# Patient Record
Sex: Female | Born: 1955 | Hispanic: Yes | Marital: Married | Smoking: Current every day smoker
Health system: Southern US, Community
[De-identification: ages and names within clinical notes are randomized; demographics above are authoritative.]

## PROBLEM LIST (undated history)

## (undated) DIAGNOSIS — K219 Gastro-esophageal reflux disease without esophagitis: Secondary | ICD-10-CM

## (undated) DIAGNOSIS — L709 Acne, unspecified: Secondary | ICD-10-CM

## (undated) DIAGNOSIS — M810 Age-related osteoporosis without current pathological fracture: Secondary | ICD-10-CM

## (undated) DIAGNOSIS — J45909 Unspecified asthma, uncomplicated: Secondary | ICD-10-CM

## (undated) DIAGNOSIS — F4310 Post-traumatic stress disorder, unspecified: Secondary | ICD-10-CM

## (undated) DIAGNOSIS — K5792 Diverticulitis of intestine, part unspecified, without perforation or abscess without bleeding: Secondary | ICD-10-CM

## (undated) DIAGNOSIS — F5104 Psychophysiologic insomnia: Secondary | ICD-10-CM

## (undated) DIAGNOSIS — M797 Fibromyalgia: Secondary | ICD-10-CM

## (undated) DIAGNOSIS — M51369 Other intervertebral disc degeneration, lumbar region without mention of lumbar back pain or lower extremity pain: Secondary | ICD-10-CM

## (undated) DIAGNOSIS — J449 Chronic obstructive pulmonary disease, unspecified: Secondary | ICD-10-CM

## (undated) DIAGNOSIS — F429 Obsessive-compulsive disorder, unspecified: Secondary | ICD-10-CM

## (undated) DIAGNOSIS — K635 Polyp of colon: Secondary | ICD-10-CM

## (undated) DIAGNOSIS — K2970 Gastritis, unspecified, without bleeding: Secondary | ICD-10-CM

## (undated) DIAGNOSIS — F419 Anxiety disorder, unspecified: Secondary | ICD-10-CM

## (undated) DIAGNOSIS — M5136 Other intervertebral disc degeneration, lumbar region: Secondary | ICD-10-CM

## (undated) HISTORY — DX: Gastritis, unspecified, without bleeding: K29.70

## (undated) HISTORY — DX: Unspecified asthma, uncomplicated: J45.909

## (undated) HISTORY — DX: Post-traumatic stress disorder, unspecified: F43.10

## (undated) HISTORY — DX: Chronic obstructive pulmonary disease, unspecified: J44.9

## (undated) HISTORY — PX: OTHER SURGICAL HISTORY: SHX169

## (undated) HISTORY — DX: Gastro-esophageal reflux disease without esophagitis: K21.9

## (undated) HISTORY — DX: Psychophysiologic insomnia: F51.04

## (undated) HISTORY — DX: Anxiety disorder, unspecified: F41.9

## (undated) HISTORY — DX: Acne, unspecified: L70.9

## (undated) HISTORY — DX: Diverticulitis of intestine, part unspecified, without perforation or abscess without bleeding: K57.92

## (undated) HISTORY — DX: Other intervertebral disc degeneration, lumbar region without mention of lumbar back pain or lower extremity pain: M51.369

## (undated) HISTORY — DX: Other intervertebral disc degeneration, lumbar region: M51.36

## (undated) HISTORY — DX: Polyp of colon: K63.5

## (undated) HISTORY — DX: Fibromyalgia: M79.7

## (undated) HISTORY — DX: Obsessive-compulsive disorder, unspecified: F42.9

## (undated) HISTORY — DX: Age-related osteoporosis without current pathological fracture: M81.0

---

## 2011-04-10 DIAGNOSIS — I4510 Unspecified right bundle-branch block: Secondary | ICD-10-CM | POA: Insufficient documentation

## 2012-11-11 DIAGNOSIS — K648 Other hemorrhoids: Secondary | ICD-10-CM | POA: Insufficient documentation

## 2012-11-11 DIAGNOSIS — F1210 Cannabis abuse, uncomplicated: Secondary | ICD-10-CM | POA: Insufficient documentation

## 2012-11-11 DIAGNOSIS — F1410 Cocaine abuse, uncomplicated: Secondary | ICD-10-CM | POA: Insufficient documentation

## 2012-11-11 DIAGNOSIS — F1010 Alcohol abuse, uncomplicated: Secondary | ICD-10-CM | POA: Insufficient documentation

## 2012-11-11 DIAGNOSIS — M159 Polyosteoarthritis, unspecified: Secondary | ICD-10-CM | POA: Insufficient documentation

## 2012-11-11 DIAGNOSIS — F419 Anxiety disorder, unspecified: Secondary | ICD-10-CM | POA: Insufficient documentation

## 2014-03-17 ENCOUNTER — Inpatient Hospital Stay (HOSPITAL_COMMUNITY): Admission: RE | Admit: 2014-03-17 | Payer: Self-pay | Source: Ambulatory Visit | Admitting: Orthopedic Surgery

## 2014-03-17 ENCOUNTER — Encounter (HOSPITAL_COMMUNITY): Admission: RE | Payer: Self-pay | Source: Ambulatory Visit

## 2014-03-17 SURGERY — ARTHROPLASTY, HIP, TOTAL, ANTERIOR APPROACH
Anesthesia: Choice | Site: Hip | Laterality: Left

## 2014-05-18 ENCOUNTER — Encounter (HOSPITAL_COMMUNITY): Admission: RE | Payer: Self-pay | Source: Ambulatory Visit

## 2014-05-18 ENCOUNTER — Inpatient Hospital Stay (HOSPITAL_COMMUNITY): Admission: RE | Admit: 2014-05-18 | Payer: Self-pay | Source: Ambulatory Visit | Admitting: Orthopedic Surgery

## 2014-05-18 SURGERY — ARTHROPLASTY, HIP, TOTAL, ANTERIOR APPROACH
Anesthesia: Spinal | Laterality: Left

## 2014-06-19 ENCOUNTER — Ambulatory Visit: Payer: Self-pay | Admitting: Physician Assistant

## 2014-06-29 ENCOUNTER — Ambulatory Visit: Payer: Self-pay | Admitting: Physician Assistant

## 2014-07-13 ENCOUNTER — Ambulatory Visit: Payer: Self-pay | Admitting: Physician Assistant

## 2014-09-04 DIAGNOSIS — M797 Fibromyalgia: Secondary | ICD-10-CM | POA: Insufficient documentation

## 2014-09-04 DIAGNOSIS — Z96642 Presence of left artificial hip joint: Secondary | ICD-10-CM | POA: Insufficient documentation

## 2014-09-04 DIAGNOSIS — F319 Bipolar disorder, unspecified: Secondary | ICD-10-CM | POA: Insufficient documentation

## 2014-09-04 HISTORY — PX: TOTAL HIP ARTHROPLASTY: SHX124

## 2014-12-01 ENCOUNTER — Ambulatory Visit: Payer: Medicare Other | Admitting: Diagnostic Neuroimaging

## 2014-12-17 ENCOUNTER — Encounter: Payer: Self-pay | Admitting: *Deleted

## 2014-12-21 ENCOUNTER — Ambulatory Visit: Payer: Medicare Other | Admitting: Diagnostic Neuroimaging

## 2015-01-13 ENCOUNTER — Ambulatory Visit (INDEPENDENT_AMBULATORY_CARE_PROVIDER_SITE_OTHER): Payer: Medicare Other | Admitting: Diagnostic Neuroimaging

## 2015-01-13 ENCOUNTER — Encounter: Payer: Self-pay | Admitting: Diagnostic Neuroimaging

## 2015-01-13 VITALS — BP 117/71 | HR 63 | Ht 65.5 in | Wt 136.5 lb

## 2015-01-13 DIAGNOSIS — R413 Other amnesia: Secondary | ICD-10-CM

## 2015-01-13 DIAGNOSIS — G894 Chronic pain syndrome: Secondary | ICD-10-CM | POA: Diagnosis not present

## 2015-01-13 DIAGNOSIS — R51 Headache: Secondary | ICD-10-CM | POA: Diagnosis not present

## 2015-01-13 DIAGNOSIS — M797 Fibromyalgia: Secondary | ICD-10-CM | POA: Diagnosis not present

## 2015-01-13 DIAGNOSIS — R519 Headache, unspecified: Secondary | ICD-10-CM

## 2015-01-13 NOTE — Patient Instructions (Signed)
-   check MRI brain to rule out other causes of headache and memory loss - follow up with psychiatry and pain management for long term treatment
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
## 2015-02-11 ENCOUNTER — Other Ambulatory Visit: Payer: Self-pay

## 2015-03-02 ENCOUNTER — Inpatient Hospital Stay: Admission: RE | Admit: 2015-03-02 | Payer: Self-pay | Source: Ambulatory Visit

## 2015-03-14 ENCOUNTER — Inpatient Hospital Stay: Admission: RE | Admit: 2015-03-14 | Payer: Self-pay | Source: Ambulatory Visit

## 2015-03-31 ENCOUNTER — Inpatient Hospital Stay: Admission: RE | Admit: 2015-03-31 | Payer: Self-pay | Source: Ambulatory Visit

## 2015-04-20 ENCOUNTER — Ambulatory Visit: Payer: Medicare Other | Admitting: Diagnostic Neuroimaging

## 2015-05-05 ENCOUNTER — Ambulatory Visit: Payer: Medicare Other | Admitting: Diagnostic Neuroimaging

## 2015-05-06 ENCOUNTER — Encounter: Payer: Self-pay | Admitting: Diagnostic Neuroimaging

## 2016-06-06 DIAGNOSIS — Z720 Tobacco use: Secondary | ICD-10-CM | POA: Insufficient documentation

## 2016-06-06 DIAGNOSIS — M1990 Unspecified osteoarthritis, unspecified site: Secondary | ICD-10-CM | POA: Insufficient documentation

## 2016-06-06 DIAGNOSIS — E559 Vitamin D deficiency, unspecified: Secondary | ICD-10-CM | POA: Insufficient documentation

## 2016-06-06 DIAGNOSIS — R768 Other specified abnormal immunological findings in serum: Secondary | ICD-10-CM | POA: Insufficient documentation

## 2016-06-06 DIAGNOSIS — S838X2S Sprain of other specified parts of left knee, sequela: Secondary | ICD-10-CM | POA: Insufficient documentation

## 2016-06-06 DIAGNOSIS — M1612 Unilateral primary osteoarthritis, left hip: Secondary | ICD-10-CM | POA: Insufficient documentation

## 2016-06-15 LAB — HM PAP SMEAR: HM PAP: NORMAL

## 2016-06-16 ENCOUNTER — Other Ambulatory Visit: Payer: Self-pay | Admitting: Obstetrics and Gynecology

## 2016-06-16 DIAGNOSIS — Z1239 Encounter for other screening for malignant neoplasm of breast: Secondary | ICD-10-CM

## 2016-06-19 ENCOUNTER — Other Ambulatory Visit: Payer: Self-pay | Admitting: Obstetrics and Gynecology

## 2016-06-19 DIAGNOSIS — Z1239 Encounter for other screening for malignant neoplasm of breast: Secondary | ICD-10-CM

## 2016-06-19 DIAGNOSIS — N644 Mastodynia: Secondary | ICD-10-CM

## 2016-07-12 ENCOUNTER — Inpatient Hospital Stay
Admission: RE | Admit: 2016-07-12 | Discharge: 2016-07-12 | Disposition: A | Discharge status: Home / Self Care | Payer: Self-pay | Source: Ambulatory Visit | Attending: *Deleted | Admitting: *Deleted

## 2016-07-12 ENCOUNTER — Other Ambulatory Visit: Payer: Self-pay | Admitting: *Deleted

## 2016-07-12 DIAGNOSIS — Z9289 Personal history of other medical treatment: Secondary | ICD-10-CM

## 2016-07-18 ENCOUNTER — Telehealth: Payer: Self-pay | Admitting: Obstetrics and Gynecology

## 2016-07-18 NOTE — Telephone Encounter (Signed)
Please let pt know pap smear was negative. Thx.

## 2016-07-18 NOTE — Telephone Encounter (Signed)
I spoke to the patient to give her a mammogram appointment, and she asked for someone to give her a call back with her pap results. Please advise.

## 2016-07-19 NOTE — Telephone Encounter (Signed)
Pt aware of normal pap. 

## 2016-07-27 ENCOUNTER — Ambulatory Visit: Payer: Self-pay

## 2016-07-27 ENCOUNTER — Other Ambulatory Visit: Payer: Self-pay

## 2016-08-08 ENCOUNTER — Other Ambulatory Visit: Payer: Self-pay

## 2016-09-12 ENCOUNTER — Ambulatory Visit: Admission: RE | Admit: 2016-09-12 | Payer: Self-pay | Source: Ambulatory Visit

## 2016-09-12 ENCOUNTER — Other Ambulatory Visit: Payer: Self-pay

## 2016-09-12 ENCOUNTER — Inpatient Hospital Stay: Admission: RE | Admit: 2016-09-12 | Payer: Self-pay | Source: Ambulatory Visit

## 2016-11-24 DIAGNOSIS — G4733 Obstructive sleep apnea (adult) (pediatric): Secondary | ICD-10-CM | POA: Diagnosis not present

## 2016-11-27 ENCOUNTER — Telehealth: Payer: Self-pay

## 2016-11-27 NOTE — Telephone Encounter (Signed)
Pt calling for pap results.  Adv pap was negative.  Pt c/o d/c and is uncomfortable c voiding.  Appt 4:30 Thurs c ABC.

## 2016-11-30 ENCOUNTER — Ambulatory Visit: Payer: Medicare Other | Admitting: Obstetrics and Gynecology

## 2016-12-06 DIAGNOSIS — J42 Unspecified chronic bronchitis: Secondary | ICD-10-CM | POA: Diagnosis not present

## 2016-12-13 ENCOUNTER — Ambulatory Visit: Payer: Medicare Other | Admitting: Family Medicine

## 2016-12-20 DIAGNOSIS — E119 Type 2 diabetes mellitus without complications: Secondary | ICD-10-CM | POA: Diagnosis not present

## 2016-12-20 DIAGNOSIS — M797 Fibromyalgia: Secondary | ICD-10-CM | POA: Diagnosis not present

## 2016-12-20 DIAGNOSIS — G8929 Other chronic pain: Secondary | ICD-10-CM | POA: Diagnosis not present

## 2016-12-20 DIAGNOSIS — Z96642 Presence of left artificial hip joint: Secondary | ICD-10-CM | POA: Diagnosis not present

## 2016-12-20 DIAGNOSIS — M545 Low back pain: Secondary | ICD-10-CM | POA: Diagnosis not present

## 2016-12-20 DIAGNOSIS — M810 Age-related osteoporosis without current pathological fracture: Secondary | ICD-10-CM | POA: Diagnosis not present

## 2016-12-20 DIAGNOSIS — M5442 Lumbago with sciatica, left side: Secondary | ICD-10-CM | POA: Diagnosis not present

## 2016-12-20 DIAGNOSIS — Z471 Aftercare following joint replacement surgery: Secondary | ICD-10-CM | POA: Diagnosis not present

## 2016-12-20 DIAGNOSIS — J449 Chronic obstructive pulmonary disease, unspecified: Secondary | ICD-10-CM | POA: Diagnosis not present

## 2016-12-20 DIAGNOSIS — M25551 Pain in right hip: Secondary | ICD-10-CM | POA: Diagnosis not present

## 2016-12-20 DIAGNOSIS — M25552 Pain in left hip: Secondary | ICD-10-CM | POA: Diagnosis not present

## 2016-12-25 DIAGNOSIS — G4733 Obstructive sleep apnea (adult) (pediatric): Secondary | ICD-10-CM | POA: Diagnosis not present

## 2016-12-29 DIAGNOSIS — J42 Unspecified chronic bronchitis: Secondary | ICD-10-CM | POA: Diagnosis not present

## 2017-01-02 DIAGNOSIS — M545 Low back pain: Secondary | ICD-10-CM | POA: Diagnosis not present

## 2017-01-02 DIAGNOSIS — M5432 Sciatica, left side: Secondary | ICD-10-CM | POA: Diagnosis not present

## 2017-01-10 DIAGNOSIS — M4316 Spondylolisthesis, lumbar region: Secondary | ICD-10-CM | POA: Diagnosis not present

## 2017-01-10 DIAGNOSIS — Z8261 Family history of arthritis: Secondary | ICD-10-CM | POA: Diagnosis not present

## 2017-01-10 DIAGNOSIS — M4317 Spondylolisthesis, lumbosacral region: Secondary | ICD-10-CM | POA: Diagnosis not present

## 2017-01-10 DIAGNOSIS — Z888 Allergy status to other drugs, medicaments and biological substances status: Secondary | ICD-10-CM | POA: Diagnosis not present

## 2017-01-10 DIAGNOSIS — Z7951 Long term (current) use of inhaled steroids: Secondary | ICD-10-CM | POA: Diagnosis not present

## 2017-01-10 DIAGNOSIS — Z96642 Presence of left artificial hip joint: Secondary | ICD-10-CM | POA: Diagnosis not present

## 2017-01-10 DIAGNOSIS — M8588 Other specified disorders of bone density and structure, other site: Secondary | ICD-10-CM | POA: Diagnosis not present

## 2017-01-10 DIAGNOSIS — G4700 Insomnia, unspecified: Secondary | ICD-10-CM | POA: Diagnosis not present

## 2017-01-10 DIAGNOSIS — M797 Fibromyalgia: Secondary | ICD-10-CM | POA: Diagnosis not present

## 2017-01-10 DIAGNOSIS — M25562 Pain in left knee: Secondary | ICD-10-CM | POA: Diagnosis not present

## 2017-01-10 DIAGNOSIS — Z8262 Family history of osteoporosis: Secondary | ICD-10-CM | POA: Diagnosis not present

## 2017-01-10 DIAGNOSIS — Z7952 Long term (current) use of systemic steroids: Secondary | ICD-10-CM | POA: Diagnosis not present

## 2017-01-10 DIAGNOSIS — M810 Age-related osteoporosis without current pathological fracture: Secondary | ICD-10-CM | POA: Diagnosis not present

## 2017-01-10 DIAGNOSIS — J449 Chronic obstructive pulmonary disease, unspecified: Secondary | ICD-10-CM | POA: Diagnosis not present

## 2017-01-10 DIAGNOSIS — Z882 Allergy status to sulfonamides status: Secondary | ICD-10-CM | POA: Diagnosis not present

## 2017-01-16 ENCOUNTER — Ambulatory Visit: Payer: Medicare Other | Admitting: Family Medicine

## 2017-01-23 DIAGNOSIS — J42 Unspecified chronic bronchitis: Secondary | ICD-10-CM | POA: Diagnosis not present

## 2017-01-26 DIAGNOSIS — M25552 Pain in left hip: Secondary | ICD-10-CM | POA: Diagnosis not present

## 2017-01-26 DIAGNOSIS — Z96642 Presence of left artificial hip joint: Secondary | ICD-10-CM | POA: Diagnosis not present

## 2017-01-26 DIAGNOSIS — G8929 Other chronic pain: Secondary | ICD-10-CM | POA: Diagnosis not present

## 2017-01-26 DIAGNOSIS — M25559 Pain in unspecified hip: Secondary | ICD-10-CM | POA: Diagnosis not present

## 2017-02-05 DIAGNOSIS — H40013 Open angle with borderline findings, low risk, bilateral: Secondary | ICD-10-CM | POA: Diagnosis not present

## 2017-02-05 DIAGNOSIS — H25813 Combined forms of age-related cataract, bilateral: Secondary | ICD-10-CM | POA: Diagnosis not present

## 2017-02-13 ENCOUNTER — Ambulatory Visit: Payer: Medicare Other | Admitting: Family Medicine

## 2017-02-15 DIAGNOSIS — M79644 Pain in right finger(s): Secondary | ICD-10-CM | POA: Diagnosis not present

## 2017-02-15 DIAGNOSIS — M1710 Unilateral primary osteoarthritis, unspecified knee: Secondary | ICD-10-CM | POA: Diagnosis not present

## 2017-02-15 DIAGNOSIS — M25562 Pain in left knee: Secondary | ICD-10-CM | POA: Diagnosis not present

## 2017-02-15 DIAGNOSIS — M19041 Primary osteoarthritis, right hand: Secondary | ICD-10-CM | POA: Diagnosis not present

## 2017-02-15 DIAGNOSIS — M25531 Pain in right wrist: Secondary | ICD-10-CM | POA: Diagnosis not present

## 2017-02-15 DIAGNOSIS — G8929 Other chronic pain: Secondary | ICD-10-CM | POA: Diagnosis not present

## 2017-02-15 DIAGNOSIS — M170 Bilateral primary osteoarthritis of knee: Secondary | ICD-10-CM | POA: Diagnosis not present

## 2017-02-28 DIAGNOSIS — J42 Unspecified chronic bronchitis: Secondary | ICD-10-CM | POA: Diagnosis not present

## 2017-02-28 DIAGNOSIS — M4317 Spondylolisthesis, lumbosacral region: Secondary | ICD-10-CM | POA: Diagnosis not present

## 2017-03-13 DIAGNOSIS — M1710 Unilateral primary osteoarthritis, unspecified knee: Secondary | ICD-10-CM | POA: Diagnosis not present

## 2017-03-13 DIAGNOSIS — M25462 Effusion, left knee: Secondary | ICD-10-CM | POA: Diagnosis not present

## 2017-03-13 DIAGNOSIS — M4317 Spondylolisthesis, lumbosacral region: Secondary | ICD-10-CM | POA: Diagnosis not present

## 2017-03-13 DIAGNOSIS — M23307 Other meniscus derangements, unspecified meniscus, left knee: Secondary | ICD-10-CM | POA: Diagnosis not present

## 2017-03-14 ENCOUNTER — Ambulatory Visit: Payer: Medicare Other | Admitting: Family Medicine

## 2017-03-15 DIAGNOSIS — Z760 Encounter for issue of repeat prescription: Secondary | ICD-10-CM | POA: Diagnosis not present

## 2017-03-15 DIAGNOSIS — M25462 Effusion, left knee: Secondary | ICD-10-CM | POA: Diagnosis not present

## 2017-03-15 DIAGNOSIS — M4317 Spondylolisthesis, lumbosacral region: Secondary | ICD-10-CM | POA: Diagnosis not present

## 2017-03-15 DIAGNOSIS — M1710 Unilateral primary osteoarthritis, unspecified knee: Secondary | ICD-10-CM | POA: Diagnosis not present

## 2017-03-15 DIAGNOSIS — M23307 Other meniscus derangements, unspecified meniscus, left knee: Secondary | ICD-10-CM | POA: Diagnosis not present

## 2017-03-20 DIAGNOSIS — M1710 Unilateral primary osteoarthritis, unspecified knee: Secondary | ICD-10-CM | POA: Diagnosis not present

## 2017-03-20 DIAGNOSIS — M23307 Other meniscus derangements, unspecified meniscus, left knee: Secondary | ICD-10-CM | POA: Diagnosis not present

## 2017-03-20 DIAGNOSIS — M4317 Spondylolisthesis, lumbosacral region: Secondary | ICD-10-CM | POA: Diagnosis not present

## 2017-03-20 DIAGNOSIS — M25462 Effusion, left knee: Secondary | ICD-10-CM | POA: Diagnosis not present

## 2017-03-21 DIAGNOSIS — M4317 Spondylolisthesis, lumbosacral region: Secondary | ICD-10-CM | POA: Diagnosis not present

## 2017-03-21 DIAGNOSIS — M23307 Other meniscus derangements, unspecified meniscus, left knee: Secondary | ICD-10-CM | POA: Diagnosis not present

## 2017-03-21 DIAGNOSIS — M1710 Unilateral primary osteoarthritis, unspecified knee: Secondary | ICD-10-CM | POA: Diagnosis not present

## 2017-03-21 DIAGNOSIS — M25462 Effusion, left knee: Secondary | ICD-10-CM | POA: Diagnosis not present

## 2017-03-27 DIAGNOSIS — M23307 Other meniscus derangements, unspecified meniscus, left knee: Secondary | ICD-10-CM | POA: Diagnosis not present

## 2017-03-27 DIAGNOSIS — M4317 Spondylolisthesis, lumbosacral region: Secondary | ICD-10-CM | POA: Diagnosis not present

## 2017-03-27 DIAGNOSIS — M1710 Unilateral primary osteoarthritis, unspecified knee: Secondary | ICD-10-CM | POA: Diagnosis not present

## 2017-03-27 DIAGNOSIS — M25462 Effusion, left knee: Secondary | ICD-10-CM | POA: Diagnosis not present

## 2017-03-29 DIAGNOSIS — M4317 Spondylolisthesis, lumbosacral region: Secondary | ICD-10-CM | POA: Diagnosis not present

## 2017-03-29 DIAGNOSIS — M25462 Effusion, left knee: Secondary | ICD-10-CM | POA: Diagnosis not present

## 2017-03-29 DIAGNOSIS — M23307 Other meniscus derangements, unspecified meniscus, left knee: Secondary | ICD-10-CM | POA: Diagnosis not present

## 2017-03-29 DIAGNOSIS — M1710 Unilateral primary osteoarthritis, unspecified knee: Secondary | ICD-10-CM | POA: Diagnosis not present

## 2017-04-03 DIAGNOSIS — M1710 Unilateral primary osteoarthritis, unspecified knee: Secondary | ICD-10-CM | POA: Diagnosis not present

## 2017-04-03 DIAGNOSIS — M23307 Other meniscus derangements, unspecified meniscus, left knee: Secondary | ICD-10-CM | POA: Diagnosis not present

## 2017-04-03 DIAGNOSIS — M25462 Effusion, left knee: Secondary | ICD-10-CM | POA: Diagnosis not present

## 2017-04-03 DIAGNOSIS — M4317 Spondylolisthesis, lumbosacral region: Secondary | ICD-10-CM | POA: Diagnosis not present

## 2017-04-05 DIAGNOSIS — M1710 Unilateral primary osteoarthritis, unspecified knee: Secondary | ICD-10-CM | POA: Diagnosis not present

## 2017-04-05 DIAGNOSIS — M4317 Spondylolisthesis, lumbosacral region: Secondary | ICD-10-CM | POA: Diagnosis not present

## 2017-04-05 DIAGNOSIS — M545 Low back pain: Secondary | ICD-10-CM | POA: Diagnosis not present

## 2017-04-05 DIAGNOSIS — M23307 Other meniscus derangements, unspecified meniscus, left knee: Secondary | ICD-10-CM | POA: Diagnosis not present

## 2017-04-05 DIAGNOSIS — M25462 Effusion, left knee: Secondary | ICD-10-CM | POA: Diagnosis not present

## 2017-04-09 DIAGNOSIS — M25462 Effusion, left knee: Secondary | ICD-10-CM | POA: Diagnosis not present

## 2017-04-09 DIAGNOSIS — M23307 Other meniscus derangements, unspecified meniscus, left knee: Secondary | ICD-10-CM | POA: Diagnosis not present

## 2017-04-09 DIAGNOSIS — M4317 Spondylolisthesis, lumbosacral region: Secondary | ICD-10-CM | POA: Diagnosis not present

## 2017-04-09 DIAGNOSIS — M1710 Unilateral primary osteoarthritis, unspecified knee: Secondary | ICD-10-CM | POA: Diagnosis not present

## 2017-04-11 DIAGNOSIS — J209 Acute bronchitis, unspecified: Secondary | ICD-10-CM | POA: Diagnosis not present

## 2017-05-07 DIAGNOSIS — J42 Unspecified chronic bronchitis: Secondary | ICD-10-CM | POA: Diagnosis not present

## 2017-05-09 DIAGNOSIS — M1710 Unilateral primary osteoarthritis, unspecified knee: Secondary | ICD-10-CM | POA: Diagnosis not present

## 2017-05-09 DIAGNOSIS — M23307 Other meniscus derangements, unspecified meniscus, left knee: Secondary | ICD-10-CM | POA: Diagnosis not present

## 2017-05-09 DIAGNOSIS — M4317 Spondylolisthesis, lumbosacral region: Secondary | ICD-10-CM | POA: Diagnosis not present

## 2017-05-09 DIAGNOSIS — M25462 Effusion, left knee: Secondary | ICD-10-CM | POA: Diagnosis not present

## 2017-05-10 DIAGNOSIS — M25462 Effusion, left knee: Secondary | ICD-10-CM | POA: Diagnosis not present

## 2017-05-10 DIAGNOSIS — M4317 Spondylolisthesis, lumbosacral region: Secondary | ICD-10-CM | POA: Diagnosis not present

## 2017-05-10 DIAGNOSIS — M1710 Unilateral primary osteoarthritis, unspecified knee: Secondary | ICD-10-CM | POA: Diagnosis not present

## 2017-05-10 DIAGNOSIS — M23307 Other meniscus derangements, unspecified meniscus, left knee: Secondary | ICD-10-CM | POA: Diagnosis not present

## 2017-05-17 DIAGNOSIS — M23307 Other meniscus derangements, unspecified meniscus, left knee: Secondary | ICD-10-CM | POA: Diagnosis not present

## 2017-05-17 DIAGNOSIS — M4317 Spondylolisthesis, lumbosacral region: Secondary | ICD-10-CM | POA: Diagnosis not present

## 2017-05-17 DIAGNOSIS — M25462 Effusion, left knee: Secondary | ICD-10-CM | POA: Diagnosis not present

## 2017-05-17 DIAGNOSIS — M1710 Unilateral primary osteoarthritis, unspecified knee: Secondary | ICD-10-CM | POA: Diagnosis not present

## 2017-05-29 DIAGNOSIS — M1710 Unilateral primary osteoarthritis, unspecified knee: Secondary | ICD-10-CM | POA: Diagnosis not present

## 2017-05-29 DIAGNOSIS — M23307 Other meniscus derangements, unspecified meniscus, left knee: Secondary | ICD-10-CM | POA: Diagnosis not present

## 2017-05-29 DIAGNOSIS — M25462 Effusion, left knee: Secondary | ICD-10-CM | POA: Diagnosis not present

## 2017-05-29 DIAGNOSIS — M4317 Spondylolisthesis, lumbosacral region: Secondary | ICD-10-CM | POA: Diagnosis not present

## 2017-05-31 DIAGNOSIS — M4317 Spondylolisthesis, lumbosacral region: Secondary | ICD-10-CM | POA: Diagnosis not present

## 2017-05-31 DIAGNOSIS — M25462 Effusion, left knee: Secondary | ICD-10-CM | POA: Diagnosis not present

## 2017-05-31 DIAGNOSIS — H401131 Primary open-angle glaucoma, bilateral, mild stage: Secondary | ICD-10-CM | POA: Diagnosis not present

## 2017-05-31 DIAGNOSIS — M1710 Unilateral primary osteoarthritis, unspecified knee: Secondary | ICD-10-CM | POA: Diagnosis not present

## 2017-05-31 DIAGNOSIS — M23307 Other meniscus derangements, unspecified meniscus, left knee: Secondary | ICD-10-CM | POA: Diagnosis not present

## 2017-06-06 DIAGNOSIS — J42 Unspecified chronic bronchitis: Secondary | ICD-10-CM | POA: Diagnosis not present

## 2017-06-18 ENCOUNTER — Ambulatory Visit: Payer: Medicare Other | Admitting: Obstetrics and Gynecology

## 2017-06-19 ENCOUNTER — Ambulatory Visit: Payer: Medicare Other | Admitting: Family Medicine

## 2017-07-03 ENCOUNTER — Ambulatory Visit: Payer: Medicare Other

## 2017-07-05 DIAGNOSIS — G8929 Other chronic pain: Secondary | ICD-10-CM | POA: Diagnosis not present

## 2017-07-16 ENCOUNTER — Encounter: Payer: Self-pay | Admitting: Obstetrics and Gynecology

## 2017-07-16 ENCOUNTER — Encounter: Payer: Medicare Other | Admitting: Obstetrics and Gynecology

## 2017-07-17 NOTE — Progress Notes (Signed)
This encounter was created in error - please disregard.

## 2017-08-09 DIAGNOSIS — K210 Gastro-esophageal reflux disease with esophagitis: Secondary | ICD-10-CM | POA: Diagnosis not present

## 2017-08-09 DIAGNOSIS — K449 Diaphragmatic hernia without obstruction or gangrene: Secondary | ICD-10-CM | POA: Diagnosis not present

## 2017-08-09 DIAGNOSIS — K2970 Gastritis, unspecified, without bleeding: Secondary | ICD-10-CM | POA: Diagnosis not present

## 2017-08-09 DIAGNOSIS — R1013 Epigastric pain: Secondary | ICD-10-CM | POA: Diagnosis not present

## 2017-08-28 ENCOUNTER — Ambulatory Visit: Payer: Medicare Other | Admitting: Obstetrics and Gynecology

## 2018-02-26 DIAGNOSIS — M2540 Effusion, unspecified joint: Secondary | ICD-10-CM | POA: Insufficient documentation

## 2018-02-26 DIAGNOSIS — R609 Edema, unspecified: Secondary | ICD-10-CM | POA: Insufficient documentation

## 2018-09-13 DIAGNOSIS — M2240 Chondromalacia patellae, unspecified knee: Secondary | ICD-10-CM | POA: Insufficient documentation

## 2018-09-13 DIAGNOSIS — M47816 Spondylosis without myelopathy or radiculopathy, lumbar region: Secondary | ICD-10-CM | POA: Insufficient documentation

## 2018-09-16 ENCOUNTER — Other Ambulatory Visit: Payer: Self-pay | Admitting: Internal Medicine

## 2018-09-16 DIAGNOSIS — Z1231 Encounter for screening mammogram for malignant neoplasm of breast: Secondary | ICD-10-CM

## 2018-10-11 ENCOUNTER — Telehealth: Payer: Self-pay | Admitting: *Deleted

## 2018-10-11 NOTE — Telephone Encounter (Signed)
Voicemail left regarding scheduling of lung screening scan patient is referred for.

## 2018-10-16 ENCOUNTER — Telehealth: Payer: Self-pay | Admitting: *Deleted

## 2018-10-16 ENCOUNTER — Encounter: Payer: Self-pay | Admitting: *Deleted

## 2018-10-16 DIAGNOSIS — Z87891 Personal history of nicotine dependence: Secondary | ICD-10-CM

## 2018-10-16 DIAGNOSIS — Z122 Encounter for screening for malignant neoplasm of respiratory organs: Secondary | ICD-10-CM

## 2018-10-16 NOTE — Telephone Encounter (Signed)
Received referral for initial lung cancer screening scan. Contacted patient and obtained smoking history,(current, 49 pack year) as well as answering questions related to screening process. Patient denies signs of lung cancer such as weight loss or hemoptysis. Patient denies comorbidity that would prevent curative treatment if lung cancer were found. Patient is scheduled for shared decision making visit and CT scan on 10/24/18 at 1pm.

## 2018-10-24 ENCOUNTER — Other Ambulatory Visit: Payer: Self-pay

## 2018-10-24 ENCOUNTER — Inpatient Hospital Stay: Payer: Medicare Other | Attending: Nurse Practitioner | Admitting: Hospice and Palliative Medicine

## 2018-10-24 ENCOUNTER — Ambulatory Visit
Admission: RE | Admit: 2018-10-24 | Discharge: 2018-10-24 | Disposition: A | Discharge status: Home / Self Care | Payer: Medicare Other | Source: Ambulatory Visit | Attending: Nurse Practitioner | Admitting: Nurse Practitioner

## 2018-10-24 DIAGNOSIS — Z122 Encounter for screening for malignant neoplasm of respiratory organs: Secondary | ICD-10-CM

## 2018-10-24 DIAGNOSIS — Z87891 Personal history of nicotine dependence: Secondary | ICD-10-CM | POA: Insufficient documentation

## 2018-10-24 NOTE — Progress Notes (Signed)
In accordance with CMS guidelines, patient has met eligibility criteria including age, absence of signs or symptoms of lung cancer.  Social History   Tobacco Use  . Smoking status: Current Every Day Smoker    Packs/day: 1.00    Years: 49.00    Pack years: 49.00    Types: Cigarettes  Substance Use Topics  . Alcohol use: Yes    Alcohol/week: 0.0 standard drinks    Comment: occ  wine  . Drug use: No      A shared decision-making session was conducted prior to the performance of CT scan. This includes one or more decision aids, includes benefits and harms of screening, follow-up diagnostic testing, over-diagnosis, false positive rate, and total radiation exposure.   Counseling on the importance of adherence to annual lung cancer LDCT screening, impact of co-morbidities, and ability or willingness to undergo diagnosis and treatment is imperative for compliance of the program.   Counseling on the importance of continued smoking cessation for former smokers; the importance of smoking cessation for current smokers, and information about tobacco cessation interventions have been given to patient including [place] and [address] quit [place] programs.   Written order for lung cancer screening with LDCT has been given to the patient and any and all questions have been answered to the best of my abilities.    Yearly follow up will be coordinated by Burgess Estelle, Thoracic Navigator.  Time Total: 15 minutes  Visit consisted of counseling and education dealing with complex health screening. Greater than 50%  of this time was spent counseling and coordinating care related to the above assessment and plan.  Signed by: Altha Harm, PhD, NP-C [phone] (Work Cell)

## 2018-10-25 ENCOUNTER — Telehealth: Payer: Self-pay | Admitting: *Deleted

## 2018-10-25 NOTE — Telephone Encounter (Signed)
Notified patient of LDCT lung cancer screening program results with recommendation for 12 month follow up imaging. Also notified of incidental findings noted below and is encouraged to discuss further with PCP who will receive a copy of this note and/or the CT report. Patient verbalizes understanding.   IMPRESSION: 1. Lung-RADS 1, negative. Continue annual screening with low-dose chest CT without contrast in 12 months. 2. Nonobstructive bilateral nephrolithiasis. 3. Small pericardial effusion.  Aortic Atherosclerosis (ICD10-I70.0) and Emphysema (ICD10-J43.9).

## 2019-01-08 ENCOUNTER — Other Ambulatory Visit: Payer: Self-pay | Admitting: Student

## 2019-01-08 DIAGNOSIS — G8929 Other chronic pain: Secondary | ICD-10-CM

## 2019-01-08 DIAGNOSIS — M545 Low back pain, unspecified: Secondary | ICD-10-CM

## 2019-01-19 ENCOUNTER — Ambulatory Visit
Admission: RE | Admit: 2019-01-19 | Discharge: 2019-01-19 | Disposition: A | Discharge status: Home / Self Care | Payer: Medicare Other | Source: Ambulatory Visit | Attending: Student | Admitting: Student

## 2019-01-19 ENCOUNTER — Other Ambulatory Visit: Payer: Self-pay

## 2019-01-19 DIAGNOSIS — G8929 Other chronic pain: Secondary | ICD-10-CM | POA: Insufficient documentation

## 2019-01-19 DIAGNOSIS — M545 Low back pain: Secondary | ICD-10-CM | POA: Insufficient documentation

## 2019-03-07 DIAGNOSIS — R635 Abnormal weight gain: Secondary | ICD-10-CM | POA: Insufficient documentation

## 2019-03-07 DIAGNOSIS — M25552 Pain in left hip: Secondary | ICD-10-CM | POA: Insufficient documentation

## 2019-03-07 DIAGNOSIS — G4733 Obstructive sleep apnea (adult) (pediatric): Secondary | ICD-10-CM | POA: Insufficient documentation

## 2019-05-02 ENCOUNTER — Other Ambulatory Visit: Payer: Self-pay | Admitting: Internal Medicine

## 2019-05-02 DIAGNOSIS — Z1231 Encounter for screening mammogram for malignant neoplasm of breast: Secondary | ICD-10-CM

## 2019-05-05 ENCOUNTER — Other Ambulatory Visit (HOSPITAL_COMMUNITY): Payer: Self-pay | Admitting: Internal Medicine

## 2019-05-05 ENCOUNTER — Other Ambulatory Visit: Payer: Self-pay | Admitting: Internal Medicine

## 2019-05-05 DIAGNOSIS — L52 Erythema nodosum: Secondary | ICD-10-CM

## 2019-05-05 DIAGNOSIS — R2243 Localized swelling, mass and lump, lower limb, bilateral: Secondary | ICD-10-CM

## 2019-05-06 ENCOUNTER — Other Ambulatory Visit: Payer: Self-pay

## 2019-05-06 ENCOUNTER — Other Ambulatory Visit: Payer: Self-pay | Admitting: Internal Medicine

## 2019-05-06 ENCOUNTER — Ambulatory Visit: Payer: Medicare Other

## 2019-05-06 ENCOUNTER — Ambulatory Visit
Admission: RE | Admit: 2019-05-06 | Discharge: 2019-05-06 | Disposition: A | Discharge status: Home / Self Care | Payer: Medicare Other | Source: Ambulatory Visit | Attending: Internal Medicine | Admitting: Internal Medicine

## 2019-05-06 DIAGNOSIS — R413 Other amnesia: Secondary | ICD-10-CM

## 2019-05-06 DIAGNOSIS — R233 Spontaneous ecchymoses: Secondary | ICD-10-CM | POA: Diagnosis present

## 2019-05-06 DIAGNOSIS — R2689 Other abnormalities of gait and mobility: Secondary | ICD-10-CM

## 2019-05-06 DIAGNOSIS — Z720 Tobacco use: Secondary | ICD-10-CM

## 2019-05-06 DIAGNOSIS — R609 Edema, unspecified: Secondary | ICD-10-CM

## 2019-05-08 ENCOUNTER — Other Ambulatory Visit: Payer: Self-pay

## 2019-05-08 ENCOUNTER — Ambulatory Visit
Admission: RE | Admit: 2019-05-08 | Discharge: 2019-05-08 | Disposition: A | Discharge status: Home / Self Care | Payer: Medicare Other | Source: Ambulatory Visit | Attending: Internal Medicine | Admitting: Internal Medicine

## 2019-05-08 ENCOUNTER — Other Ambulatory Visit: Payer: Self-pay | Admitting: Internal Medicine

## 2019-05-08 DIAGNOSIS — M7989 Other specified soft tissue disorders: Secondary | ICD-10-CM | POA: Diagnosis present

## 2019-05-08 DIAGNOSIS — R2689 Other abnormalities of gait and mobility: Secondary | ICD-10-CM | POA: Insufficient documentation

## 2019-05-08 DIAGNOSIS — R233 Spontaneous ecchymoses: Secondary | ICD-10-CM | POA: Insufficient documentation

## 2019-05-08 DIAGNOSIS — R609 Edema, unspecified: Secondary | ICD-10-CM | POA: Insufficient documentation

## 2019-05-20 ENCOUNTER — Ambulatory Visit: Payer: Medicare Other

## 2019-05-27 ENCOUNTER — Other Ambulatory Visit: Payer: Self-pay

## 2019-05-27 ENCOUNTER — Ambulatory Visit
Admission: RE | Admit: 2019-05-27 | Discharge: 2019-05-27 | Disposition: A | Discharge status: Home / Self Care | Payer: Medicare Other | Source: Ambulatory Visit | Attending: Internal Medicine | Admitting: Internal Medicine

## 2019-05-27 DIAGNOSIS — R413 Other amnesia: Secondary | ICD-10-CM | POA: Insufficient documentation

## 2019-05-27 DIAGNOSIS — R2689 Other abnormalities of gait and mobility: Secondary | ICD-10-CM | POA: Diagnosis present

## 2019-07-14 ENCOUNTER — Ambulatory Visit: Payer: Medicare Other | Attending: Neurology

## 2019-07-14 DIAGNOSIS — Z9989 Dependence on other enabling machines and devices: Secondary | ICD-10-CM | POA: Insufficient documentation

## 2019-07-14 DIAGNOSIS — G4733 Obstructive sleep apnea (adult) (pediatric): Secondary | ICD-10-CM | POA: Diagnosis present

## 2019-08-25 ENCOUNTER — Other Ambulatory Visit: Payer: Self-pay

## 2019-09-09 ENCOUNTER — Other Ambulatory Visit: Payer: Self-pay | Admitting: Internal Medicine

## 2019-09-09 DIAGNOSIS — Z1231 Encounter for screening mammogram for malignant neoplasm of breast: Secondary | ICD-10-CM

## 2019-10-01 ENCOUNTER — Other Ambulatory Visit (INDEPENDENT_AMBULATORY_CARE_PROVIDER_SITE_OTHER): Payer: Self-pay | Admitting: Nurse Practitioner

## 2019-10-01 DIAGNOSIS — I83899 Varicose veins of unspecified lower extremities with other complications: Secondary | ICD-10-CM

## 2019-10-03 ENCOUNTER — Encounter (INDEPENDENT_AMBULATORY_CARE_PROVIDER_SITE_OTHER): Payer: Medicare Other

## 2019-10-03 ENCOUNTER — Encounter (INDEPENDENT_AMBULATORY_CARE_PROVIDER_SITE_OTHER): Payer: Medicare Other | Admitting: Nurse Practitioner

## 2019-10-06 ENCOUNTER — Encounter (INDEPENDENT_AMBULATORY_CARE_PROVIDER_SITE_OTHER): Payer: Medicare Other

## 2019-10-06 ENCOUNTER — Encounter (INDEPENDENT_AMBULATORY_CARE_PROVIDER_SITE_OTHER): Payer: Medicare Other | Admitting: Nurse Practitioner

## 2019-10-13 ENCOUNTER — Telehealth: Payer: Self-pay

## 2019-10-13 NOTE — Telephone Encounter (Signed)
Unsuccessful attempt at calling patient to notify them that it is time to schedule the low dose lung cancer screening CT scan. 

## 2019-10-14 ENCOUNTER — Telehealth: Payer: Self-pay | Admitting: *Deleted

## 2019-10-14 DIAGNOSIS — Z122 Encounter for screening for malignant neoplasm of respiratory organs: Secondary | ICD-10-CM

## 2019-10-14 DIAGNOSIS — Z87891 Personal history of nicotine dependence: Secondary | ICD-10-CM

## 2019-10-14 NOTE — Telephone Encounter (Signed)
Patient has been notified that annual lung cancer screening low dose CT scan is due currently or will be in near future. Confirmed that patient is within the age range of 55-77, and asymptomatic, (no signs or symptoms of lung cancer). Patient denies illness that would prevent curative treatment for lung cancer if found. Verified smoking history, (current, 49.5 pack year). The shared decision making visit was done 10/24/18. Patient is agreeable for CT scan being scheduled.

## 2019-10-17 ENCOUNTER — Encounter (INDEPENDENT_AMBULATORY_CARE_PROVIDER_SITE_OTHER): Payer: Medicare Other

## 2019-10-17 ENCOUNTER — Encounter (INDEPENDENT_AMBULATORY_CARE_PROVIDER_SITE_OTHER): Payer: Medicare Other | Admitting: Nurse Practitioner

## 2019-10-28 ENCOUNTER — Ambulatory Visit: Payer: Medicare Other | Admitting: Student in an Organized Health Care Education/Training Program

## 2019-10-29 ENCOUNTER — Ambulatory Visit: Payer: Medicare Other | Admitting: Dermatology

## 2019-10-31 ENCOUNTER — Ambulatory Visit: Admission: RE | Admit: 2019-10-31 | Payer: Medicare Other | Source: Ambulatory Visit

## 2019-11-03 ENCOUNTER — Inpatient Hospital Stay: Admission: RE | Admit: 2019-11-03 | Payer: Medicare Other | Source: Ambulatory Visit

## 2019-11-04 ENCOUNTER — Other Ambulatory Visit: Payer: Self-pay | Admitting: Obstetrics and Gynecology

## 2019-11-04 ENCOUNTER — Other Ambulatory Visit: Payer: Medicare Other

## 2019-11-04 DIAGNOSIS — N644 Mastodynia: Secondary | ICD-10-CM

## 2019-11-06 ENCOUNTER — Inpatient Hospital Stay: Admit: 2019-11-06 | Payer: Medicare Other | Admitting: Orthopedic Surgery

## 2019-11-06 SURGERY — ARTHROPLASTY, HIP, TOTAL, ANTERIOR APPROACH
Anesthesia: Choice | Site: Hip | Laterality: Right

## 2019-11-18 ENCOUNTER — Ambulatory Visit: Payer: Medicare Other | Admitting: Student in an Organized Health Care Education/Training Program

## 2019-11-26 ENCOUNTER — Other Ambulatory Visit: Payer: Self-pay | Admitting: Obstetrics and Gynecology

## 2019-11-26 DIAGNOSIS — N644 Mastodynia: Secondary | ICD-10-CM

## 2019-12-03 NOTE — Telephone Encounter (Signed)
Patient will reschedule after her hip surgery

## 2019-12-04 ENCOUNTER — Ambulatory Visit: Payer: Medicare Other

## 2019-12-09 ENCOUNTER — Other Ambulatory Visit: Payer: Self-pay | Admitting: Orthopedic Surgery

## 2019-12-10 ENCOUNTER — Inpatient Hospital Stay: Admission: RE | Admit: 2019-12-10 | Payer: Medicare Other | Source: Ambulatory Visit

## 2019-12-10 ENCOUNTER — Ambulatory Visit: Payer: Self-pay | Admitting: Licensed Clinical Social Worker

## 2019-12-12 ENCOUNTER — Other Ambulatory Visit: Payer: Medicare Other

## 2019-12-16 ENCOUNTER — Inpatient Hospital Stay: Admission: RE | Admit: 2019-12-16 | Payer: Medicare Other | Source: Home / Self Care | Admitting: Orthopedic Surgery

## 2019-12-16 ENCOUNTER — Encounter: Admission: RE | Payer: Self-pay | Source: Home / Self Care

## 2019-12-16 SURGERY — ARTHROPLASTY, HIP, TOTAL, ANTERIOR APPROACH
Anesthesia: Choice | Site: Hip | Laterality: Right

## 2019-12-26 ENCOUNTER — Encounter (INDEPENDENT_AMBULATORY_CARE_PROVIDER_SITE_OTHER): Payer: Self-pay | Admitting: Nurse Practitioner

## 2019-12-26 ENCOUNTER — Other Ambulatory Visit: Payer: Self-pay

## 2019-12-26 ENCOUNTER — Ambulatory Visit (INDEPENDENT_AMBULATORY_CARE_PROVIDER_SITE_OTHER): Payer: Medicare Other | Admitting: Nurse Practitioner

## 2019-12-26 ENCOUNTER — Ambulatory Visit (INDEPENDENT_AMBULATORY_CARE_PROVIDER_SITE_OTHER): Payer: Medicare Other

## 2019-12-26 VITALS — BP 164/93 | HR 58 | Resp 14 | Ht 65.0 in | Wt 161.0 lb

## 2019-12-26 DIAGNOSIS — I10 Essential (primary) hypertension: Secondary | ICD-10-CM

## 2019-12-26 DIAGNOSIS — I83899 Varicose veins of unspecified lower extremities with other complications: Secondary | ICD-10-CM

## 2019-12-26 DIAGNOSIS — M797 Fibromyalgia: Secondary | ICD-10-CM

## 2019-12-26 NOTE — Telephone Encounter (Signed)
Contacted and scheduled 

## 2019-12-29 ENCOUNTER — Encounter: Payer: Self-pay | Admitting: Licensed Clinical Social Worker

## 2019-12-29 ENCOUNTER — Ambulatory Visit (INDEPENDENT_AMBULATORY_CARE_PROVIDER_SITE_OTHER): Payer: Medicare Other | Admitting: Licensed Clinical Social Worker

## 2019-12-29 ENCOUNTER — Other Ambulatory Visit: Payer: Self-pay

## 2019-12-29 DIAGNOSIS — F419 Anxiety disorder, unspecified: Secondary | ICD-10-CM | POA: Diagnosis not present

## 2019-12-29 DIAGNOSIS — F4310 Post-traumatic stress disorder, unspecified: Secondary | ICD-10-CM
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
## 2019-12-30 ENCOUNTER — Encounter (INDEPENDENT_AMBULATORY_CARE_PROVIDER_SITE_OTHER): Payer: Self-pay | Admitting: Nurse Practitioner

## 2019-12-30 NOTE — Progress Notes (Signed)
Subjective:    Patient ID: Vanessa Best, female    DOB: October 21, 1955, 64 y.o.   MRN: [id number] Chief Complaint  Patient presents with  . Follow-up    ultrasound    The patient is a 64 year old female that presents as a referral from Dr. Marcello Fennel.  The patient notes having lower extremity pain and swelling.  The patient notes that the swelling started in her back and crept around towards her abdomen and extended to her lower extremities.  Both legs swell equally and are equally painful.  The pain does not change with activity.  She denies any rest pain like symptoms.  The patient does note that she is unable to stand for long periods of time without having extensive pain and weakness and needing to sit.  The patient also notes that she was supposed to have surgery for her back, however she is is unsure about proceeding.  The patient does have consistent back pain however.  She denies any rest pain or ulcerations.  She denies any TIA-like symptoms.  She denies any chest pain or shortness of breath.  The patient denies having worn medical grade 1 compression previously  Today noninvasive study showed no evidence of DVT or superficial venous thrombosis bilaterally..  The patient has no evidence of deep venous insufficiency seen bilaterally.  There is also no evidence of reflux seen within the bilateral short saphenous veins.  The left lower extremity has no evidence of venous reflux.  The right lower extremity has evidence of venous reflux in the great saphenous vein at the saphenofemoral junction extending to the proximal thigh.   Review of Systems  Cardiovascular: Positive for leg swelling.  Musculoskeletal: Positive for arthralgias, back pain, joint swelling and myalgias.  All other systems reviewed and are negative.      Objective:   Physical Exam Vitals reviewed.  HENT:     Head: Normocephalic.  Cardiovascular:     Rate and Rhythm: Normal rate and regular rhythm.     Pulses: Normal  pulses.     Heart sounds: Normal heart sounds.  Pulmonary:     Effort: Pulmonary effort is normal.     Breath sounds: Normal breath sounds.  Abdominal:     General: Abdomen is flat.     Palpations: Abdomen is soft.  Musculoskeletal:     Right lower leg: No edema.     Left lower leg: No edema.  Skin:    General: Skin is warm and dry.     Capillary Refill: Capillary refill takes less than 2 seconds.  Neurological:     Mental Status: She is alert and oriented to person, place, and time.  Psychiatric:        Mood and Affect: Mood normal.        Behavior: Behavior normal.        Thought Content: Thought content normal.        Judgment: Judgment normal.     BP (!) 164/93 (BP Location: Right Arm)   Pulse (!) 58   Resp 14   Ht 5\' 5"  (1.651 m)   Wt 161 lb (73 kg)   BMI 26.79 kg/m   Past Medical History:  Diagnosis Date  . Acid reflux   . Acne   . Anxiety   . Asthma   . Chronic insomnia   . Colon polyps   . COPD (chronic obstructive pulmonary disease) (HCC)   . Degenerative disc disease, lumbar   . Diverticulitis   .  Fibromyalgia   . Gastritis   . GERD (gastroesophageal reflux disease)   . OCD (obsessive compulsive disorder)   . Osteoporosis   . PTSD (post-traumatic stress disorder)     Social History   Socioeconomic History  . Marital status: Married    Spouse name: Not on file  . Number of children: 2  . Years of education: colleg  . Highest education level: Not on file  Occupational History  . Occupation: disabled  Tobacco Use  . Smoking status: Current Every Day Smoker    Packs/day: 1.00    Years: 49.00    Pack years: 49.00    Types: Cigarettes  . Smokeless tobacco: Never Used  Substance and Sexual Activity  . Alcohol use: Yes    Alcohol/week: 0.0 standard drinks    Comment: occ  wine  . Drug use: No  . Sexual activity: Not on file  Other Topics Concern  . Not on file  Social History Narrative   Married, lives at home with husband.  Has 2  children and 6 grandchildren.   Social Determinants of Health   Financial Resource Strain:   . Difficulty of Paying Living Expenses:   Food Insecurity:   . Worried About Programme researcher, broadcasting/film/video in the Last Year:   . Barista in the Last Year:   Transportation Needs:   . Freight forwarder (Medical):   Marland Kitchen Lack of Transportation (Non-Medical):   Physical Activity:   . Days of Exercise per Week:   . Minutes of Exercise per Session:   Stress:   . Feeling of Stress :   Social Connections:   . Frequency of Communication with Friends and Family:   . Frequency of Social Gatherings with Friends and Family:   . Attends Religious Services:   . Active Member of Clubs or Organizations:   . Attends Banker Meetings:   Marland Kitchen Marital Status:   Intimate Partner Violence:   . Fear of Current or Ex-Partner:   . Emotionally Abused:   Marland Kitchen Physically Abused:   . Sexually Abused:     Past Surgical History:  Procedure Laterality Date  . torn menicus    . TOTAL HIP ARTHROPLASTY  09-04-14    Family History  Problem Relation Age of Onset  . Prostate cancer Father   . Bone cancer Father   . Heart defect Father        valve  . Heart failure Father   . Osteoporosis Father   . Asthma Mother   . Eczema Sister     Allergies  Allergen Reactions  . Gabapentin Hives, Itching and Swelling  . Hydrocodone-Acetaminophen Anaphylaxis    Pt states she can take Hydrocodone, not sure how and why this was placed here  . Pregabalin Nausea And Vomiting and Swelling  . Sulfa Antibiotics Hives       Assessment & Plan:   1. Varicose veins of lower extremity with edema, unspecified laterality Based upon the patient's description of pain in the bilateral lower extremities, it is unlikely that this is related to her varicose veins.  This is because the patient only has reflux found in her right lower extremity however the pain is equally unrelenting with equal swelling.  I believe that a portion  of her pain is related to the hip surgery that she was previously scheduled to have.  Also, the patient has hepatitis C which could account for some of the generalized edema however an upcoming cardiology  appointment could also rule out any possible cardiac causes.  Also, given the extensive swelling lymphedema cannot will be ruled out either.  The patient has not utilized any sort of of compression previously.  We will attempt to have the patient utilize medical grade 1 compression stockings for the next 6 weeks to determine how this helps with swelling.  The patient was also instructed to elevate her lower extremities and to walk as possible.  Pending the outcome of the patient's conservative therapy, the patient may also be considered for a lymphedema pump.  2. Primary fibromyalgia syndrome This may be contributing to the patient's pain as well.  3. Essential hypertension Continue antihypertensive medications as already ordered, these medications have been reviewed and there are no changes at this time.    Current Outpatient Medications on File Prior to Visit  Medication Sig Dispense Refill  . acetaminophen (TYLENOL) 500 MG tablet Take 1,000 mg by mouth every 8 (eight) hours as needed for moderate pain.    Marland Kitchen albuterol (PROVENTIL) (2.5 MG/3ML) 0.083% nebulizer solution Take 2.5 mg by nebulization every 4 (four) hours as needed for wheezing or shortness of breath.    Marland Kitchen albuterol (VENTOLIN HFA) 108 (90 Base) MCG/ACT inhaler Inhale into the lungs every 6 (six) hours as needed for wheezing or shortness of breath.    . ALPRAZolam (XANAX) 0.5 MG tablet Take 0.5-1 mg by mouth in the morning and at bedtime.     . cyclobenzaprine (FLEXERIL) 10 MG tablet Take 10 mg by mouth 3 (three) times daily as needed for muscle spasms.    . diazepam (VALIUM) 2 MG tablet Take 2 mg by mouth every 6 (six) hours as needed for anxiety.    Marland Kitchen EPINEPHrine 0.3 mg/0.3 mL IJ SOAJ injection Inject into the muscle as needed.      . Fluticasone-Umeclidin-Vilant (TRELEGY ELLIPTA) 100-62.5-25 MCG/INH AEPB Inhale 1 puff into the lungs daily as needed (asthma).     . ibuprofen (ADVIL) 800 MG tablet Take 800 mg by mouth every 8 (eight) hours as needed for moderate pain.    Marland Kitchen ipratropium (ATROVENT) 0.02 % nebulizer solution Take 0.5 mg by nebulization every 4 (four) hours as needed for wheezing or shortness of breath.    . oxyCODONE (OXY IR/ROXICODONE) 5 MG immediate release tablet Take 5 mg by mouth 2 (two) times daily as needed.    . pantoprazole (PROTONIX) 40 MG tablet Take 40 mg by mouth daily.    . QUEtiapine (SEROQUEL) 100 MG tablet Take 100 mg by mouth at bedtime.     . traMADol (ULTRAM) 50 MG tablet Take 100 mg by mouth every 6 (six) hours as needed for moderate pain.    . Vitamin D, Ergocalciferol, (DRISDOL) 1.25 MG (50000 UNIT) CAPS capsule Take 50,000 Units by mouth every 7 (seven) days. Friday    . aspirin 81 MG chewable tablet Chew 81 mg by mouth. (Patient not taking: Reported on 12/03/2019)     No current facility-administered medications on file prior to visit.    There are no Patient Instructions on file for this visit. No follow-ups on file.   Georgiana Spinner, NP

## 2020-01-01 DIAGNOSIS — R0789 Other chest pain: Secondary | ICD-10-CM | POA: Insufficient documentation

## 2020-01-02 ENCOUNTER — Ambulatory Visit: Admission: RE | Admit: 2020-01-02 | Payer: Medicare Other | Source: Ambulatory Visit

## 2020-01-02 DIAGNOSIS — Z01810 Encounter for preprocedural cardiovascular examination: Secondary | ICD-10-CM | POA: Insufficient documentation

## 2020-01-06 ENCOUNTER — Ambulatory Visit: Payer: Medicare Other

## 2020-01-07 ENCOUNTER — Telehealth: Payer: Self-pay | Admitting: *Deleted

## 2020-01-07 NOTE — Telephone Encounter (Signed)
Patient cancelled lung screening scan and reports she is overwhelmed with too many appointments. Patient requests to consider screening scan in November, 2nd week.

## 2020-01-08 ENCOUNTER — Other Ambulatory Visit: Payer: Self-pay

## 2020-01-08 ENCOUNTER — Ambulatory Visit: Payer: Medicaid Other | Admitting: Licensed Clinical Social Worker

## 2020-01-09 ENCOUNTER — Other Ambulatory Visit: Payer: Medicare Other

## 2020-01-13 ENCOUNTER — Other Ambulatory Visit: Payer: Self-pay

## 2020-01-13 ENCOUNTER — Encounter: Payer: Self-pay | Admitting: Licensed Clinical Social Worker

## 2020-01-13 ENCOUNTER — Ambulatory Visit (INDEPENDENT_AMBULATORY_CARE_PROVIDER_SITE_OTHER): Payer: Medicare Other | Admitting: Licensed Clinical Social Worker

## 2020-01-13 DIAGNOSIS — F4310 Post-traumatic stress disorder, unspecified: Secondary | ICD-10-CM | POA: Diagnosis not present

## 2020-01-13 DIAGNOSIS — F411 Generalized anxiety disorder: Secondary | ICD-10-CM | POA: Diagnosis not present

## 2020-01-13 NOTE — Progress Notes (Signed)
Patient Location: Home  Provider Location: Home Office   Virtual Visit via Telephone Note  I connected with [name] on 01/13/20 at 10:00 AM EDT by telephone and verified that I am speaking with the correct person using two identifiers.   I discussed the limitations, risks, security and privacy concerns of performing an evaluation and management service by telephone and the availability of in person appointments. I also discussed with the patient that there may be a patient responsible charge related to this service. The patient expressed understanding and agreed to proceed.  THERAPY PROGRESS NOTE  Session Time: 89 Minutes  Participation Level: Active  Behavioral Response: AlertAnxious  Type of Therapy: Individual Therapy  Treatment Goals addressed: Anxiety and Coping  Interventions: CBT  Summary: Vanessa Best is a 64 y.o. female who presents with anxiety sxs. Pt reported having a bad week and missed appointment yesterday due to having to take her uncle to the hospital yesterday. Pt reported that she is feeling overwhelmed with her own pain and anxiety while at the same time responsible for caring for her uncle who is on orders to stay at home in bed with heart monitor. Pt identified ways she is trying to cope and acknowledged her supports that she can rely on. Pt reported tendency to say she is fine and not accept help from others, however realizes that her uncle does the same and needs to set that example. Pt reported she has been praying, going to church and will ask for support from her church members who have offered assistance. Pt processed her thoughts and feelings and reported feeling better being able to talk to someone about these stressors.  Suicidal/Homicidal: No  Therapist Response: Therapist met with patient for first session since completing CCA. Therapist and patient reviewed treatment plan and goals. Pt in agreement. Therapist and patient discussed current  stressors and attempts to cope. Therapist encouraged patient to utilize her supports available and validated patient's feelings and concerns. Pt was very receptive.  Plan: Return again in 2 weeks.  Diagnosis: Axis I: Generalized Anxiety Disorder and Post Traumatic Stress Disorder    Axis II: N/A  Josephine Igo, LCSW, LCAS 01/13/2020

## 2020-01-27 ENCOUNTER — Encounter: Payer: Self-pay | Admitting: Radiology

## 2020-01-27 ENCOUNTER — Ambulatory Visit
Admission: RE | Admit: 2020-01-27 | Discharge: 2020-01-27 | Disposition: A | Discharge status: Home / Self Care | Payer: Medicare Other | Source: Ambulatory Visit | Attending: Obstetrics and Gynecology | Admitting: Obstetrics and Gynecology

## 2020-01-27 DIAGNOSIS — N644 Mastodynia: Secondary | ICD-10-CM | POA: Diagnosis present

## 2020-01-28 ENCOUNTER — Ambulatory Visit (INDEPENDENT_AMBULATORY_CARE_PROVIDER_SITE_OTHER): Payer: Medicare Other | Admitting: Nurse Practitioner

## 2020-01-29 ENCOUNTER — Other Ambulatory Visit: Payer: Self-pay

## 2020-01-29 ENCOUNTER — Ambulatory Visit: Payer: Medicaid Other | Admitting: Licensed Clinical Social Worker

## 2020-02-04 ENCOUNTER — Encounter (INDEPENDENT_AMBULATORY_CARE_PROVIDER_SITE_OTHER): Payer: Self-pay | Admitting: Nurse Practitioner

## 2020-02-04 ENCOUNTER — Ambulatory Visit (INDEPENDENT_AMBULATORY_CARE_PROVIDER_SITE_OTHER): Payer: Medicare Other | Admitting: Nurse Practitioner

## 2020-02-04 ENCOUNTER — Other Ambulatory Visit: Payer: Self-pay

## 2020-02-04 VITALS — BP 132/93 | HR 61 | Ht 64.0 in | Wt 159.0 lb

## 2020-02-04 DIAGNOSIS — I83899 Varicose veins of unspecified lower extremities with other complications: Secondary | ICD-10-CM | POA: Diagnosis not present

## 2020-02-04 DIAGNOSIS — M25551 Pain in right hip: Secondary | ICD-10-CM | POA: Diagnosis not present

## 2020-02-04 DIAGNOSIS — M25552 Pain in left hip: Secondary | ICD-10-CM

## 2020-02-04 DIAGNOSIS — I10 Essential (primary) hypertension: Secondary | ICD-10-CM

## 2020-02-04 MED ORDER — DICLOFENAC SODIUM 1 % EX GEL: 2.0000 g | Freq: Four times a day (QID) | CUTANEOUS | 1 refills | Status: DC

## 2020-02-05 ENCOUNTER — Ambulatory Visit: Payer: Medicare Other

## 2020-02-09 ENCOUNTER — Encounter (INDEPENDENT_AMBULATORY_CARE_PROVIDER_SITE_OTHER): Payer: Self-pay | Admitting: Nurse Practitioner

## 2020-02-09 ENCOUNTER — Other Ambulatory Visit: Payer: Self-pay

## 2020-02-09 ENCOUNTER — Ambulatory Visit (INDEPENDENT_AMBULATORY_CARE_PROVIDER_SITE_OTHER): Payer: Medicare Other | Admitting: Licensed Clinical Social Worker

## 2020-02-09 ENCOUNTER — Encounter: Payer: Self-pay | Admitting: Licensed Clinical Social Worker

## 2020-02-09 DIAGNOSIS — F4310 Post-traumatic stress disorder, unspecified: Secondary | ICD-10-CM

## 2020-02-09 DIAGNOSIS — F411 Generalized anxiety disorder: Secondary | ICD-10-CM

## 2020-02-09 NOTE — Progress Notes (Signed)
Patient Location: Home  Provider Location: Home Office   Virtual Visit via Video Note  I connected with Vanessa Best on 02/09/20 at  2:00 PM EDT by a video enabled telemedicine application and verified that I am speaking with the correct person using two identifiers.   I discussed the limitations of evaluation and management by telemedicine and the availability of in person appointments. The patient expressed understanding and agreed to proceed.  THERAPY PROGRESS NOTE  Session Time: 30 Minutes  Participation Level: Active  Behavioral Response: Well GroomedAlertEuthymic  Type of Therapy: Individual Therapy  Treatment Goals addressed: Anxiety and Coping  Interventions: CBT and DBT  Summary: Vanessa Best is a 64 y.o. female who presents with anxiety sxs. Pt reported she is managing her sxs better since last session by relying on her support system including her higher power, setting boundaries, and acknowledging what she has and does not have control over. Pt reported reduction in anxiety and although she continues to have urges to clean and organize, she is actively trying to suppress thoughts "by ignoring them" and not engaging in compulsions. Pt reframed anxiety as feeling "overwhelmed" and is recognizing her own limitations while also adopting a hopeful attitude w/ goals to continue to be active.   Suicidal/Homicidal: No  Therapist Response: Therapist met with patient for follow up session. Therapist and patient reviewed sxs as well as what is working and not working. Therapist provided psychoeducation around balancing acceptance and change. Therapist validated patient's efforts to manage sxs and utilize supports. Pt was very receptive.  Plan: Return again in 1 week.  Diagnosis: Axis I: Generalized Anxiety Disorder and Post Traumatic Stress Disorder    Axis II: N/A  Josephine Igo, LCSW, LCAS 02/09/2020

## 2020-02-09 NOTE — Progress Notes (Signed)
Subjective:    Patient ID: Vanessa Best, female    DOB: 11/20/1955, 64 y.o.   MRN: [id number] Chief Complaint  Patient presents with  . Follow-up    5 week no studies    The patient returns for followup evaluation 3 months after the initial visit. The patient continues to have pain in the lower extremities with dependency. The pain is lessened with elevation. Graduated compression stockings, Class I (20-30 mmHg), have been worn but the stockings do not eliminate the leg pain.  The patient has also had a reevaluation with a orthopedic surgeon regarding her lower extremity pain.  It is found that the patient does need surgery for her hips with the left being more urgent than the right.  Over-the-counter analgesics do not improve the symptoms.  The patient has noticed improvement with lower extremity edema with utilizing medical grade 1 compression stockings and they do improve filling her lower extremities.   Review of Systems  Musculoskeletal: Positive for arthralgias, gait problem, joint swelling and myalgias.  All other systems reviewed and are negative.      Objective:   Physical Exam Vitals reviewed.  HENT:     Head: Normocephalic.  Cardiovascular:     Rate and Rhythm: Normal rate and regular rhythm.     Pulses: Normal pulses.     Heart sounds: Normal heart sounds.  Pulmonary:     Effort: Pulmonary effort is normal.  Neurological:     Mental Status: She is alert and oriented to person, place, and time.     Gait: Gait abnormal.  Psychiatric:        Mood and Affect: Mood normal.        Behavior: Behavior normal.        Thought Content: Thought content normal.        Judgment: Judgment normal.     BP (!) 132/93   Pulse 61   Ht 5\' 4"  (1.626 m)   Wt 159 lb (72.1 kg)   BMI 27.29 kg/m   Past Medical History:  Diagnosis Date  . Acid reflux   . Acne   . Anxiety   . Asthma   . Chronic insomnia   . Colon polyps   . COPD (chronic obstructive pulmonary disease) (HCC)    . Degenerative disc disease, lumbar   . Diverticulitis   . Fibromyalgia   . Gastritis   . GERD (gastroesophageal reflux disease)   . OCD (obsessive compulsive disorder)   . Osteoporosis   . PTSD (post-traumatic stress disorder)     Social History   Socioeconomic History  . Marital status: Married    Spouse name: Not on file  . Number of children: 2  . Years of education: colleg  . Highest education level: Not on file  Occupational History  . Occupation: disabled  Tobacco Use  . Smoking status: Current Every Day Smoker    Packs/day: 1.00    Years: 49.00    Pack years: 49.00    Types: Cigarettes  . Smokeless tobacco: Never Used  Substance and Sexual Activity  . Alcohol use: Yes    Alcohol/week: 0.0 standard drinks    Comment: occ  wine  . Drug use: No  . Sexual activity: Not on file  Other Topics Concern  . Not on file  Social History Narrative   Married, lives at home with husband.  Has 2 children and 6 grandchildren.   Social Determinants of Health   Financial Resource Strain:   .  Difficulty of Paying Living Expenses: Not on file  Food Insecurity:   . Worried About Programme researcher, broadcasting/film/video in the Last Year: Not on file  . Ran Out of Food in the Last Year: Not on file  Transportation Needs:   . Lack of Transportation (Medical): Not on file  . Lack of Transportation (Non-Medical): Not on file  Physical Activity:   . Days of Exercise per Week: Not on file  . Minutes of Exercise per Session: Not on file  Stress:   . Feeling of Stress : Not on file  Social Connections:   . Frequency of Communication with Friends and Family: Not on file  . Frequency of Social Gatherings with Friends and Family: Not on file  . Attends Religious Services: Not on file  . Active Member of Clubs or Organizations: Not on file  . Attends Banker Meetings: Not on file  . Marital Status: Not on file  Intimate Partner Violence:   . Fear of Current or Ex-Partner: Not on file  .  Emotionally Abused: Not on file  . Physically Abused: Not on file  . Sexually Abused: Not on file    Past Surgical History:  Procedure Laterality Date  . torn menicus    . TOTAL HIP ARTHROPLASTY  09-04-14    Family History  Problem Relation Age of Onset  . Prostate cancer Father   . Bone cancer Father   . Heart defect Father        valve  . Heart failure Father   . Osteoporosis Father   . Asthma Mother   . Eczema Sister   . Breast cancer Maternal Aunt 30    Allergies  Allergen Reactions  . Gabapentin Hives, Itching and Swelling  . Hydrocodone-Acetaminophen Anaphylaxis    Pt states she can take Hydrocodone, not sure how and why this was placed here  . Pregabalin Nausea And Vomiting and Swelling  . Sulfa Antibiotics Hives       Assessment & Plan:   1. Varicose veins of lower extremity with edema, unspecified laterality Recommend:  The patient is complaining of varicose veins.    I have had a long discussion with the patient regarding  varicose veins and why they cause symptoms.  Patient will begin wearing graduated compression stockings on a daily basis, beginning first thing in the morning and removing them in the evening. The patient is instructed specifically not to sleep in the stockings.    The patient  will also begin using over-the-counter analgesics such as Motrin 600 mg po TID to help control the symptoms as needed.    In addition, behavioral modification including elevation during the day will be initiated, utilizing a recliner was recommended.  The patient is also instructed to continue exercising such as walking 4-5 times per week.  At this time the patient has upcoming hip replacement surgery plan based on this we will plan on continue with conservative therapy for now.  We will have the patient return in 6 months for follow-up and discussion about progression of conservative therapy with her varicose veins and if endovenous laser ablation is a viable option  for her. - diclofenac Sodium (VOLTAREN) 1 % GEL; Apply 2 g topically 4 (four) times daily.  Dispense: 50 g; Refill: 1  2. Bilateral hip pain Patient is currently undergoing work-up for upcoming hip surgery.  Recent orthopedic work-up found that her hips are the likely source of the patient's continued pain and discomfort.  We  will plan on following up for possible intervention of her varicose veins following her hip surgeries when she is completely healed and done with any possible surgeries.  3. Essential hypertension Continue antihypertensive medications as already ordered, these medications have been reviewed and there are no changes at this time.    Current Outpatient Medications on File Prior to Visit  Medication Sig Dispense Refill  . acetaminophen (TYLENOL) 500 MG tablet Take 1,000 mg by mouth every 8 (eight) hours as needed for moderate pain.    Marland Kitchen albuterol (PROVENTIL) (2.5 MG/3ML) 0.083% nebulizer solution Take 2.5 mg by nebulization every 4 (four) hours as needed for wheezing or shortness of breath.    Marland Kitchen albuterol (VENTOLIN HFA) 108 (90 Base) MCG/ACT inhaler Inhale into the lungs every 6 (six) hours as needed for wheezing or shortness of breath.    . ALPRAZolam (XANAX) 0.5 MG tablet Take 0.5-1 mg by mouth in the morning and at bedtime.     Marland Kitchen aspirin 81 MG chewable tablet Chew 81 mg by mouth.     . cyclobenzaprine (FLEXERIL) 10 MG tablet Take 10 mg by mouth 3 (three) times daily as needed for muscle spasms.    . diazepam (VALIUM) 2 MG tablet Take 2 mg by mouth every 6 (six) hours as needed for anxiety.    Marland Kitchen EPINEPHrine 0.3 mg/0.3 mL IJ SOAJ injection Inject into the muscle as needed.     . Fluticasone-Umeclidin-Vilant (TRELEGY ELLIPTA) 100-62.5-25 MCG/INH AEPB Inhale 1 puff into the lungs daily as needed (asthma).     . ibuprofen (ADVIL) 800 MG tablet Take 800 mg by mouth every 8 (eight) hours as needed for moderate pain.    Marland Kitchen ipratropium (ATROVENT) 0.02 % nebulizer solution Take 0.5  mg by nebulization every 4 (four) hours as needed for wheezing or shortness of breath.    . oxyCODONE (OXY IR/ROXICODONE) 5 MG immediate release tablet Take 5 mg by mouth 2 (two) times daily as needed.    . pantoprazole (PROTONIX) 40 MG tablet Take 40 mg by mouth daily.    . QUEtiapine (SEROQUEL) 100 MG tablet Take 100 mg by mouth at bedtime.     . traMADol (ULTRAM) 50 MG tablet Take 100 mg by mouth every 6 (six) hours as needed for moderate pain.    . Vitamin D, Ergocalciferol, (DRISDOL) 1.25 MG (50000 UNIT) CAPS capsule Take 50,000 Units by mouth every 7 (seven) days. Friday     No current facility-administered medications on file prior to visit.    There are no Patient Instructions on file for this visit. No follow-ups on file.   Georgiana Spinner, NP

## 2020-02-10 ENCOUNTER — Ambulatory Visit: Admission: RE | Admit: 2020-02-10 | Payer: Medicare Other | Source: Ambulatory Visit

## 2020-02-10 ENCOUNTER — Ambulatory Visit: Payer: Medicare Other

## 2020-02-18 NOTE — Telephone Encounter (Signed)
Patient reports she [place] out [place] the [place], will be having hip surgery in November, and would like to consider lung screening scan in December. Patient has my contact information.

## 2020-02-19 ENCOUNTER — Ambulatory Visit: Payer: Medicaid Other | Admitting: Licensed Clinical Social Worker

## 2020-02-20 DIAGNOSIS — B1920 Unspecified viral hepatitis C without hepatic coma: Secondary | ICD-10-CM

## 2020-02-20 HISTORY — DX: Unspecified viral hepatitis C without hepatic coma: B19.20

## 2020-02-24 ENCOUNTER — Ambulatory Visit: Admission: RE | Admit: 2020-02-24 | Payer: Medicare Other | Source: Ambulatory Visit

## 2020-03-01 ENCOUNTER — Ambulatory Visit (INDEPENDENT_AMBULATORY_CARE_PROVIDER_SITE_OTHER): Payer: 59 | Admitting: Dermatology

## 2020-03-01 ENCOUNTER — Other Ambulatory Visit: Payer: Self-pay

## 2020-03-01 DIAGNOSIS — L853 Xerosis cutis: Secondary | ICD-10-CM | POA: Diagnosis not present

## 2020-03-01 DIAGNOSIS — I8393 Asymptomatic varicose veins of bilateral lower extremities: Secondary | ICD-10-CM | POA: Diagnosis not present

## 2020-03-01 DIAGNOSIS — I872 Venous insufficiency (chronic) (peripheral): Secondary | ICD-10-CM

## 2020-03-01 DIAGNOSIS — L299 Pruritus, unspecified: Secondary | ICD-10-CM | POA: Diagnosis not present

## 2020-03-01 MED ORDER — MOMETASONE FUROATE 0.1 % EX OINT: TOPICAL_OINTMENT | Freq: Every day | CUTANEOUS | 1 refills | Status: AC

## 2020-03-01 NOTE — Progress Notes (Addendum)
   New Patient Visit  Subjective  Vanessa Best is a 64 y.o. female who presents for the following: Skin Problem (Patient here today for issue with left lower leg/feet. Skin has discoloration ). Patient advises she gets little nodules, outer foot/lower leg is dark and on the inner area it gets very red. She was seen in our office years ago and was given a topical steroid. The area does itch.  Patient saw a vein specialist and is wearing compression socks every day.   The following portions of the chart were reviewed this encounter and updated as appropriate:  Tobacco  Allergies  Meds  Problems  Med Hx  Surg Hx  Fam Hx     Review of Systems:  No other skin or systemic complaints except as noted in HPI or Assessment and Plan.  Objective  Well appearing patient in no apparent distress; mood and affect are within normal limits.  A focused examination was performed including left lower leg/foot. Relevant physical exam findings are noted in the Assessment and Plan.  Objective  Lower Legs: Hyperpigmentation with varices  Photos show edema of the left ankle  Objective  Lower Legs: Varices with hyperpigmentation of skin   Assessment & Plan  Xerosis cutis with pruritus Lower legs Recommend Gentle Skin Care, included on AVS CeraVe cream; Dove Sensitive skin soap - samples given  Venous stasis dermatitis of lower extremites with Shaumberg's Purpura  Lower Legs Continue compression socks daily. Start mometasone ointment to affected red itchy areas at lower legs, feet once daily as needed.   Ordered Medications: mometasone (ELOCON) 0.1 % ointment  Spider veins of both lower extremities Lower Legs Continue compression socks   Return if symptoms worsen or fail to improve.  Anise Salvo, RMA, am acting as scribe for Armida Sans, MD . Documentation: I have reviewed the above documentation for accuracy and completeness, and I agree with the above.  Armida Sans,  MD

## 2020-03-01 NOTE — Patient Instructions (Addendum)
Gentle Skin Care Guide  1. Bathe no more than once a day.  2. Avoid bathing in hot water  3. Use a mild soap like Dove, Vanicream, Cetaphil, CeraVe. Can use Lever 2000 or Cetaphil antibacterial soap  4. Use soap only where you need it. On most days, use it under your arms, between your legs, and on your feet. Let the water rinse other areas unless visibly dirty.  5. When you get out of the bath/shower, use a towel to gently blot your skin dry, don't rub it.  6. While your skin is still a little damp, apply a moisturizing cream such as Vanicream, CeraVe, Cetaphil, Eucerin, Sarna lotion or plain Vaseline Jelly. For hands apply Neutrogena [place] Hand Cream or Excipial Hand Cream.  7. Reapply moisturizer any time you start to itch or feel dry.  8. Sometimes using free and clear laundry detergents can be helpful. Fabric softener sheets should be avoided. Downy Free & Gentle liquid, or any liquid fabric softener that is free of dyes and perfumes, it acceptable to use  9. If your doctor has given you prescription creams you may apply moisturizers over them     Topical steroids (such as triamcinolone, fluocinolone, fluocinonide, mometasone, clobetasol, halobetasol, betamethasone, hydrocortisone) can cause thinning and lightening of the skin if they are used for too long in the same area. Your physician has selected the right strength medicine for your problem and area affected on the body. Please use your medication only as directed by your physician to prevent side effects.    Stasis Dermatitis with varices

## 2020-03-02 ENCOUNTER — Other Ambulatory Visit: Payer: Self-pay | Admitting: Gastroenterology

## 2020-03-02 ENCOUNTER — Encounter: Payer: Self-pay | Admitting: Dermatology

## 2020-03-02 DIAGNOSIS — B182 Chronic viral hepatitis C: Secondary | ICD-10-CM

## 2020-03-02 NOTE — Addendum Note (Signed)
Addended by: Deirdre Evener on: 03/02/2020 02:49 PM   Modules accepted: Level of Service

## 2020-03-09 ENCOUNTER — Ambulatory Visit: Payer: 59

## 2020-03-16 ENCOUNTER — Other Ambulatory Visit: Payer: Self-pay

## 2020-03-16 ENCOUNTER — Ambulatory Visit
Admission: RE | Admit: 2020-03-16 | Discharge: 2020-03-16 | Disposition: A | Discharge status: Home / Self Care | Payer: 59 | Source: Ambulatory Visit | Attending: Gastroenterology | Admitting: Gastroenterology

## 2020-03-16 DIAGNOSIS — B182 Chronic viral hepatitis C: Secondary | ICD-10-CM | POA: Insufficient documentation

## 2020-03-22 DIAGNOSIS — R9439 Abnormal result of other cardiovascular function study: Secondary | ICD-10-CM | POA: Insufficient documentation

## 2020-03-22 DIAGNOSIS — J439 Emphysema, unspecified: Secondary | ICD-10-CM | POA: Insufficient documentation

## 2020-03-22 DIAGNOSIS — Z8673 Personal history of transient ischemic attack (TIA), and cerebral infarction without residual deficits: Secondary | ICD-10-CM | POA: Insufficient documentation

## 2020-03-22 DIAGNOSIS — R7303 Prediabetes: Secondary | ICD-10-CM | POA: Insufficient documentation

## 2020-03-22 DIAGNOSIS — B182 Chronic viral hepatitis C: Secondary | ICD-10-CM | POA: Insufficient documentation

## 2020-03-22 DIAGNOSIS — J4520 Mild intermittent asthma, uncomplicated: Secondary | ICD-10-CM | POA: Insufficient documentation

## 2020-03-25 DIAGNOSIS — Z8249 Family history of ischemic heart disease and other diseases of the circulatory system: Secondary | ICD-10-CM | POA: Insufficient documentation

## 2020-04-02 ENCOUNTER — Other Ambulatory Visit
Admission: RE | Admit: 2020-04-02 | Discharge: 2020-04-02 | Disposition: A | Discharge status: Home / Self Care | Payer: 59 | Source: Ambulatory Visit | Attending: Cardiology | Admitting: Cardiology

## 2020-04-02 ENCOUNTER — Other Ambulatory Visit: Payer: Self-pay

## 2020-04-02 DIAGNOSIS — Z20822 Contact with and (suspected) exposure to covid-19: Secondary | ICD-10-CM | POA: Diagnosis not present

## 2020-04-02 DIAGNOSIS — Z01812 Encounter for preprocedural laboratory examination: Secondary | ICD-10-CM | POA: Diagnosis present

## 2020-04-03 LAB — SARS CORONAVIRUS 2 (TAT 6-24 HRS): SARS Coronavirus 2: NEGATIVE

## 2020-04-06 DIAGNOSIS — R9439 Abnormal result of other cardiovascular function study: Secondary | ICD-10-CM

## 2020-04-07 ENCOUNTER — Other Ambulatory Visit: Payer: Self-pay

## 2020-04-07 ENCOUNTER — Encounter
Admission: RE | Disposition: A | Discharge status: Home / Self Care | Payer: Self-pay | Source: Home / Self Care | Attending: Cardiology

## 2020-04-07 ENCOUNTER — Encounter: Payer: Self-pay | Admitting: Cardiology

## 2020-04-07 ENCOUNTER — Ambulatory Visit
Admission: RE | Admit: 2020-04-07 | Discharge: 2020-04-07 | Disposition: A | Discharge status: Home / Self Care | Payer: 59 | Attending: Cardiology | Admitting: Cardiology

## 2020-04-07 DIAGNOSIS — Z8673 Personal history of transient ischemic attack (TIA), and cerebral infarction without residual deficits: Secondary | ICD-10-CM | POA: Diagnosis not present

## 2020-04-07 DIAGNOSIS — Z8249 Family history of ischemic heart disease and other diseases of the circulatory system: Secondary | ICD-10-CM | POA: Diagnosis not present

## 2020-04-07 DIAGNOSIS — Z823 Family history of stroke: Secondary | ICD-10-CM | POA: Insufficient documentation

## 2020-04-07 DIAGNOSIS — I25119 Atherosclerotic heart disease of native coronary artery with unspecified angina pectoris: Secondary | ICD-10-CM | POA: Diagnosis not present

## 2020-04-07 DIAGNOSIS — F17210 Nicotine dependence, cigarettes, uncomplicated: Secondary | ICD-10-CM | POA: Diagnosis not present

## 2020-04-07 DIAGNOSIS — Z79899 Other long term (current) drug therapy: Secondary | ICD-10-CM | POA: Diagnosis not present

## 2020-04-07 DIAGNOSIS — Z888 Allergy status to other drugs, medicaments and biological substances status: Secondary | ICD-10-CM | POA: Diagnosis not present

## 2020-04-07 DIAGNOSIS — I4510 Unspecified right bundle-branch block: Secondary | ICD-10-CM | POA: Diagnosis not present

## 2020-04-07 DIAGNOSIS — R9439 Abnormal result of other cardiovascular function study: Secondary | ICD-10-CM | POA: Insufficient documentation

## 2020-04-07 DIAGNOSIS — Z882 Allergy status to sulfonamides status: Secondary | ICD-10-CM | POA: Insufficient documentation

## 2020-04-07 DIAGNOSIS — Z96641 Presence of right artificial hip joint: Secondary | ICD-10-CM | POA: Insufficient documentation

## 2020-04-07 HISTORY — PX: LEFT HEART CATH AND CORONARY ANGIOGRAPHY: CATH118249

## 2020-04-07 SURGERY — LEFT HEART CATH AND CORONARY ANGIOGRAPHY
Anesthesia: Moderate Sedation | Laterality: Left

## 2020-04-07 MED ORDER — LIDOCAINE HCL (PF) 1 % IJ SOLN
INTRAMUSCULAR | Status: AC
  Filled 2020-04-07: qty 30

## 2020-04-07 MED ORDER — HYDRALAZINE HCL 20 MG/ML IJ SOLN: 10.0000 mg | INTRAMUSCULAR | Status: DC | PRN

## 2020-04-07 MED ORDER — SODIUM CHLORIDE 0.9 % IV SOLN: 250.0000 mL | INTRAVENOUS | Status: DC | PRN

## 2020-04-07 MED ORDER — SODIUM CHLORIDE 0.9 % WEIGHT BASED INFUSION: 73.0000 mL/h | INTRAVENOUS | Status: DC

## 2020-04-07 MED ORDER — LIDOCAINE HCL (PF) 1 % IJ SOLN
INTRAMUSCULAR | Status: DC | PRN
  Administered 2020-04-07: 3 mL

## 2020-04-07 MED ORDER — VERAPAMIL HCL 2.5 MG/ML IV SOLN
INTRAVENOUS | Status: AC
  Filled 2020-04-07: qty 2

## 2020-04-07 MED ORDER — MIDAZOLAM HCL 2 MG/2ML IJ SOLN
INTRAMUSCULAR | Status: DC | PRN
  Administered 2020-04-07 (×2): 1 mg via INTRAVENOUS

## 2020-04-07 MED ORDER — ONDANSETRON HCL 4 MG/2ML IJ SOLN
INTRAMUSCULAR | Status: AC
  Filled 2020-04-07: qty 2

## 2020-04-07 MED ORDER — HEPARIN (PORCINE) IN NACL 1000-0.9 UT/500ML-% IV SOLN
INTRAVENOUS | Status: AC
  Filled 2020-04-07: qty 1000

## 2020-04-07 MED ORDER — SODIUM CHLORIDE 0.9% FLUSH: 3.0000 mL | INTRAVENOUS | Status: DC | PRN

## 2020-04-07 MED ORDER — ONDANSETRON HCL 4 MG/2ML IJ SOLN: 4.0000 mg | Freq: Four times a day (QID) | INTRAMUSCULAR | Status: DC | PRN

## 2020-04-07 MED ORDER — SODIUM CHLORIDE 0.9% FLUSH: 3.0000 mL | Freq: Two times a day (BID) | INTRAVENOUS | Status: DC

## 2020-04-07 MED ORDER — FENTANYL CITRATE (PF) 100 MCG/2ML IJ SOLN
INTRAMUSCULAR | Status: DC | PRN
  Administered 2020-04-07: 25 ug via INTRAVENOUS

## 2020-04-07 MED ORDER — HEPARIN (PORCINE) IN NACL 1000-0.9 UT/500ML-% IV SOLN
INTRAVENOUS | Status: DC | PRN
  Administered 2020-04-07: 500 mL

## 2020-04-07 MED ORDER — HEPARIN SODIUM (PORCINE) 1000 UNIT/ML IJ SOLN
INTRAMUSCULAR | Status: DC | PRN
  Administered 2020-04-07: 4000 [IU] via INTRAVENOUS

## 2020-04-07 MED ORDER — MIDAZOLAM HCL 2 MG/2ML IJ SOLN
INTRAMUSCULAR | Status: AC
  Filled 2020-04-07: qty 2

## 2020-04-07 MED ORDER — ASPIRIN 81 MG PO CHEW
CHEWABLE_TABLET | ORAL | Status: AC
  Filled 2020-04-07: qty 1

## 2020-04-07 MED ORDER — IOHEXOL 300 MG/ML  SOLN
INTRAMUSCULAR | Status: DC | PRN
  Administered 2020-04-07: 90 mL

## 2020-04-07 MED ORDER — SODIUM CHLORIDE 0.9 % WEIGHT BASED INFUSION: 1.0000 mL/kg/h | INTRAVENOUS | Status: DC

## 2020-04-07 MED ORDER — FENTANYL CITRATE (PF) 100 MCG/2ML IJ SOLN
INTRAMUSCULAR | Status: AC
  Filled 2020-04-07: qty 2

## 2020-04-07 MED ORDER — HEPARIN SODIUM (PORCINE) 1000 UNIT/ML IJ SOLN
INTRAMUSCULAR | Status: AC
  Filled 2020-04-07: qty 1

## 2020-04-07 MED ORDER — ASPIRIN 81 MG PO CHEW: 81.0000 mg | CHEWABLE_TABLET | ORAL | Status: DC

## 2020-04-07 MED ORDER — VERAPAMIL HCL 2.5 MG/ML IV SOLN
INTRAVENOUS | Status: DC | PRN
  Administered 2020-04-07: 2.5 mg via INTRA_ARTERIAL

## 2020-04-07 MED ORDER — SODIUM CHLORIDE 0.9 % WEIGHT BASED INFUSION
219.0000 mL/h | INTRAVENOUS | Status: AC
  Administered 2020-04-07: 3 mL/kg/h via INTRAVENOUS

## 2020-04-07 MED ORDER — LABETALOL HCL 5 MG/ML IV SOLN: 10.0000 mg | INTRAVENOUS | Status: DC | PRN

## 2020-04-07 SURGICAL SUPPLY — 9 items
CATH 5F 110X4 TIG (CATHETERS) ×3 IMPLANT
CATH INFINITI 5FR ANG PIGTAIL (CATHETERS) ×3 IMPLANT
CATH INFINITI JR4 5F (CATHETERS) ×3 IMPLANT
DEVICE RAD TR BAND REGULAR (VASCULAR PRODUCTS) ×3 IMPLANT
GLIDESHEATH SLEND SS 6F .021 (SHEATH) ×3 IMPLANT
GUIDEWIRE INQWIRE 1.5J.035X260 (WIRE) ×1 IMPLANT
INQWIRE 1.5J .035X260CM (WIRE) ×3
KIT MANI 3VAL PERCEP (MISCELLANEOUS) ×3 IMPLANT
PACK CARDIAC CATH (CUSTOM PROCEDURE TRAY) ×3 IMPLANT

## 2020-04-07 NOTE — Progress Notes (Signed)
Patient tolerated sandwich/soda/water without event.  Many questions asked and answered. Reviewed precautions with patient regarding right radial site, verbalizes understanding.

## 2020-04-07 NOTE — Progress Notes (Signed)
Patient anxious, states "I'm gonna have a panic attack"  Reviewed procedure, Dr. Darrold Junker spoke to patient pre-op.  Administered 4mg  IV zofran for c/o's nausea , no emesis, patient states "that feels better" Denies chest pain, does admit to shortness of breath.

## 2020-04-07 NOTE — Progress Notes (Signed)
Dr. Darrold Junker spoke to patient and patient's husband regarding procedure. Per Dr. Darrold Junker will start patient on HDL medication and OK for hip surgery to proceed.

## 2020-04-20 ENCOUNTER — Encounter: Payer: Self-pay | Admitting: Licensed Clinical Social Worker

## 2020-04-20 ENCOUNTER — Ambulatory Visit (INDEPENDENT_AMBULATORY_CARE_PROVIDER_SITE_OTHER): Payer: 59 | Admitting: Licensed Clinical Social Worker

## 2020-04-20 ENCOUNTER — Other Ambulatory Visit: Payer: Self-pay

## 2020-04-20 DIAGNOSIS — F4310 Post-traumatic stress disorder, unspecified: Secondary | ICD-10-CM

## 2020-04-20 DIAGNOSIS — F411 Generalized anxiety disorder: Secondary | ICD-10-CM

## 2020-04-20 NOTE — Progress Notes (Signed)
Virtual Visit via Telephone Note  I connected with Vanessa Best on 04/20/20 at  1:00 PM EST by telephone and verified that I am speaking with the correct person using two identifiers.  Participating Parties Patient Provider  Location: Patient: Vehicle Provider: Home Office   I discussed the limitations, risks, security and privacy concerns of performing an evaluation and management service by telephone and the availability of in person appointments. I also discussed with the patient that there may be a patient responsible charge related to this service. The patient expressed understanding and agreed to proceed.  THERAPY PROGRESS NOTE  Session Time: 54 Minutes  Participation Level: Active  Behavioral Response: AlertAnxious  Type of Therapy: Individual Therapy  Treatment Goals addressed: Anxiety and Coping  Interventions: CBT and Strength-based  Summary: Vanessa Best is a 64 y.o. female who presents with anxiety sxs. Pt reported feelings of "overwhelm" due to managing her own health issues as well as taking care of loved ones with significant health issues. Pt reported that her husband was dx with liver cancer and needs to get on a waiting list for a liver transplant. Pt has also experienced several deaths in the family the last few months. Pt reported she believes her husband would benefit from therapy and is encouraging him to reach out for help. Pt reported despite these stressors she continues to rely on her faith and social support of the church. Pt was able to reframe her situation from a strengths-based perspective and acknowledged importance of making time for herself and her own needs.  Suicidal/Homicidal: No  Therapist Response: Therapist met with patient for follow up after several month gap. Therapist and patient explored current stressors and coping skills utilized. Therapist validated patient's feelings/concerns. Therapist and patient reviewed progress towards goals  for treatment plan update. Pt was receptive.  Plan: Return again in 2 weeks.  Diagnosis: Axis I: Generalized Anxiety Disorder and Post Traumatic Stress Disorder    Axis II: N/A  Follow Up Instructions:  I discussed the treatment plan update with the patient. The patient was provided an opportunity to ask questions and all were answered. The patient agreed with the plan and demonstrated an understanding of the instructions.   The patient was advised to call back or seek an in-person evaluation if the symptoms worsen or if the condition fails to improve as anticipated.  I provided 45 minutes of non-face-to-face time during this encounter.   Josephine Igo, LCSW, LCAS 04/20/20

## 2020-04-29 ENCOUNTER — Other Ambulatory Visit: Payer: Self-pay | Admitting: *Deleted

## 2020-04-29 DIAGNOSIS — Z87891 Personal history of nicotine dependence: Secondary | ICD-10-CM

## 2020-04-29 DIAGNOSIS — Z122 Encounter for screening for malignant neoplasm of respiratory organs: Secondary | ICD-10-CM

## 2020-04-29 DIAGNOSIS — Z9889 Other specified postprocedural states: Secondary | ICD-10-CM | POA: Insufficient documentation

## 2020-04-29 NOTE — Progress Notes (Signed)
Contacted and scheduled for lung screening scan. Patient is a current smoker with a 49.75 pack year.

## 2020-05-06 ENCOUNTER — Ambulatory Visit (INDEPENDENT_AMBULATORY_CARE_PROVIDER_SITE_OTHER): Payer: 59 | Admitting: Licensed Clinical Social Worker

## 2020-05-06 ENCOUNTER — Encounter: Payer: Self-pay | Admitting: Licensed Clinical Social Worker

## 2020-05-06 ENCOUNTER — Other Ambulatory Visit: Payer: Self-pay

## 2020-05-06 DIAGNOSIS — F411 Generalized anxiety disorder: Secondary | ICD-10-CM

## 2020-05-06 DIAGNOSIS — F4310 Post-traumatic stress disorder, unspecified: Secondary | ICD-10-CM

## 2020-05-06 NOTE — Progress Notes (Signed)
Virtual Visit via Telephone Note  I connected with Vanessa Best on 05/06/20 at  3:00 PM EST by telephone and verified that I am speaking with the correct person using two identifiers.  Participating Parties Patient Provider  Location: Patient: Home Provider: Home Office   I discussed the limitations, risks, security and privacy concerns of performing an evaluation and management service by telephone and the availability of in person appointments. I also discussed with the patient that there may be a patient responsible charge related to this service. The patient expressed understanding and agreed to proceed.  THERAPY PROGRESS NOTE  Session Time: 8 Minutes  Participation Level: Active  Behavioral Response: AlertAnxious  Type of Therapy: Individual Therapy  Treatment Goals addressed: Anxiety and Coping  Interventions: CBT  Summary: Vanessa Best is a 64 y.o. female who presents with anxiety sxs. Pt reported continued feelings of "overwhelm" due to managing her own health issues as well as taking care of loved ones with significant health issues. Pt described ways she is able to cope and processed thoughts and feelings related to current stressors. Pt recognized her own need for family closeness and belonging and steps she took to fulfill this need by relying on existing supports and reconnecting with her daughter.    Suicidal/Homicidal: No  Therapist Response: Therapist met with patient for follow up. Therapist and patient explored current stressors and coping skills utilized. Therapist validated patient's feelings/concerns. Therapist provided psychoeducation around balancing acceptance and change to reinforce patient statements of hope while also acknowledging suffering.  Plan: Return again in 3 weeks.  Diagnosis: Axis I: Generalized Anxiety Disorder and Post Traumatic Stress Disorder    Axis II: N/A  Josephine Igo, LCSW, LCAS 05/06/2020

## 2020-05-19 ENCOUNTER — Ambulatory Visit
Admission: RE | Admit: 2020-05-19 | Discharge: 2020-05-19 | Disposition: A | Discharge status: Home / Self Care | Payer: 59 | Source: Ambulatory Visit | Attending: Nurse Practitioner | Admitting: Nurse Practitioner

## 2020-05-19 ENCOUNTER — Other Ambulatory Visit: Payer: Self-pay

## 2020-05-19 DIAGNOSIS — Z87891 Personal history of nicotine dependence: Secondary | ICD-10-CM | POA: Diagnosis present

## 2020-05-19 DIAGNOSIS — Z122 Encounter for screening for malignant neoplasm of respiratory organs: Secondary | ICD-10-CM | POA: Diagnosis present

## 2020-05-24 ENCOUNTER — Encounter: Payer: Self-pay | Admitting: *Deleted

## 2020-05-25 ENCOUNTER — Other Ambulatory Visit: Payer: Self-pay

## 2020-05-25 ENCOUNTER — Ambulatory Visit: Payer: 59 | Admitting: Licensed Clinical Social Worker

## 2020-06-06 IMAGING — MR MR HEAD W/O CM
11 series · 45 of 48 positions shown · non-contrast
Comparison: None

CLINICAL DATA: Poor balance.  Episodic memory loss.

EXAM:
MRI HEAD WITHOUT CONTRAST
TECHNIQUE: Multiplanar, multiecho pulse sequences of the brain and surrounding
structures were obtained without intravenous contrast.

[Series 5: ax dwi_tracew · axial · 3.0mm · 0.60mm/px · z∈[-85,+69]mm · 4 of 48 slices shown]
[im 1/48]
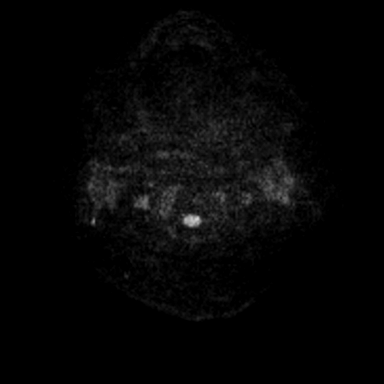
[im 16/48]
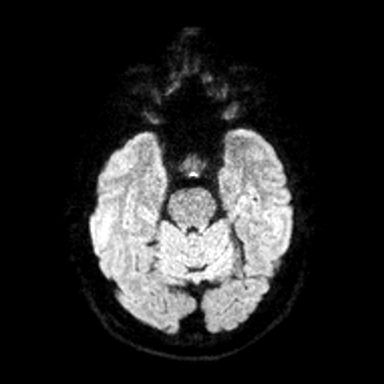
[im 32/48]
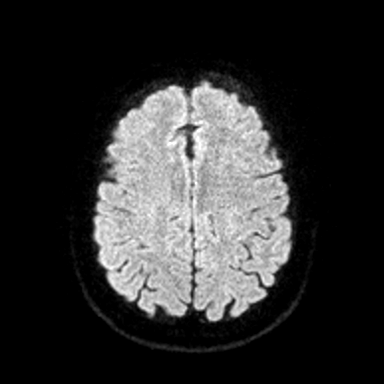
[im 48/48]
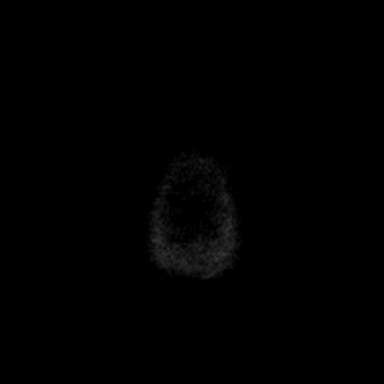

[Series 6: ax dwi_adc · axial · 3.0mm · 0.60mm/px · z∈[-85,+69]mm · 4 of 48 slices shown]
[im 1/48]
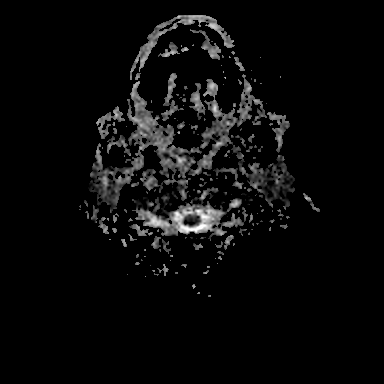
[im 16/48]
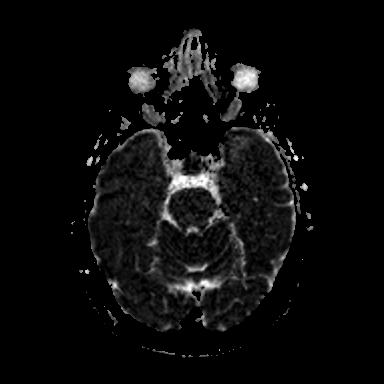
[im 32/48]
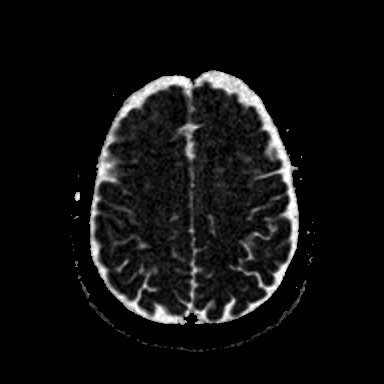
[im 48/48]
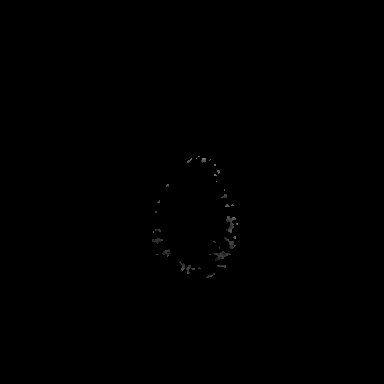

[Series 7: cor dwi_tracew · coronal · 5.0mm · 0.60mm/px · 3 of 40 slices shown]
[im 1/40]
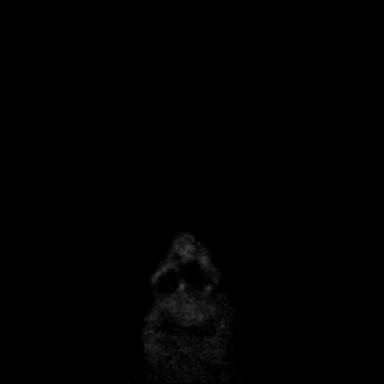
[im 20/40]
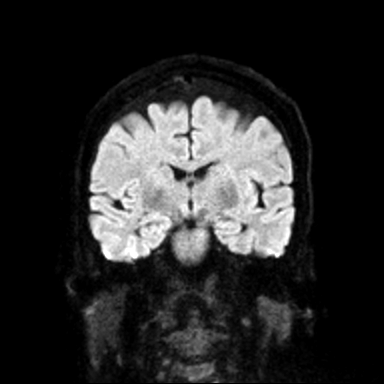
[im 40/40]
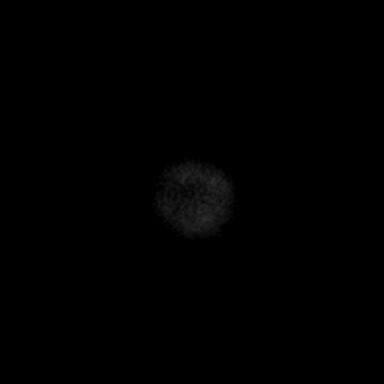

[Series 8: cor dwi_adc · coronal · 5.0mm · 0.60mm/px · 3 of 38 slices shown]
[im 1/38]
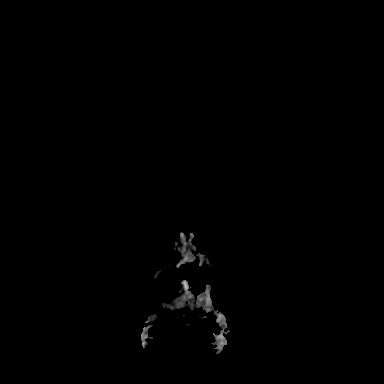
[im 19/38]
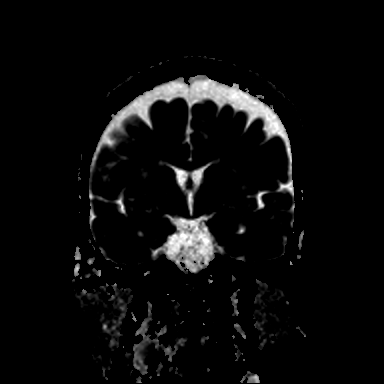
[im 38/38]
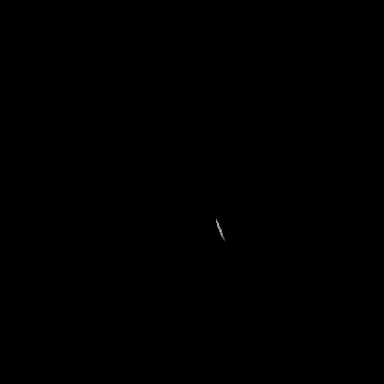

[Series 9: T1 · sagittal · 5.0mm · 0.62mm/px · 2 of 23 slices shown (1 of 2)]
[im 1/23]
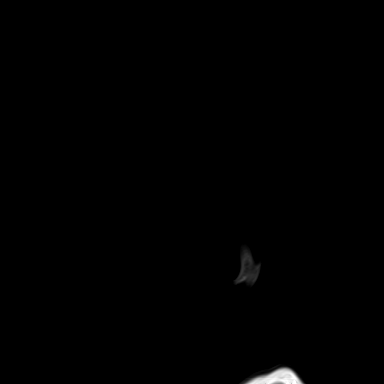
[im 23/23]
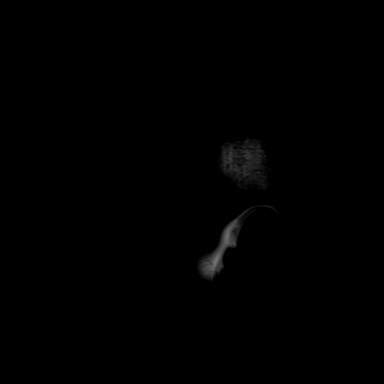

[Series 10: T2 · axial · 5.0mm · 0.53mm/px · z∈[-80,+63]mm · 2 of 25 slices shown (1 of 2)]
[im 1/25]
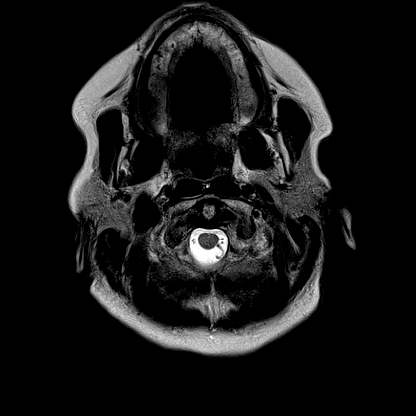
[im 25/25]
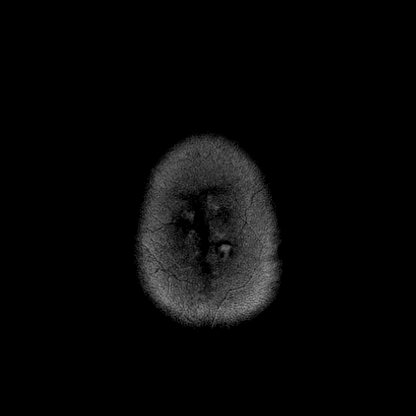

[Series 12: pha_images · axial · 3.0mm · 0.90mm/px · z∈[-97,+79]mm · 5 of 60 slices shown]
[im 1/60]
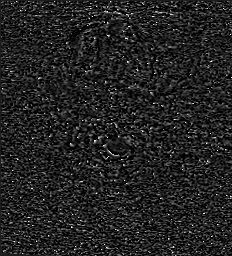
[im 15/60]
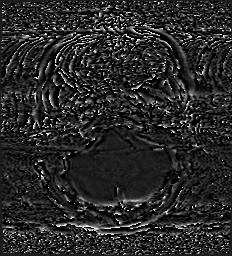
[im 30/60]
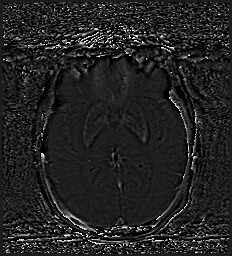
[im 45/60]
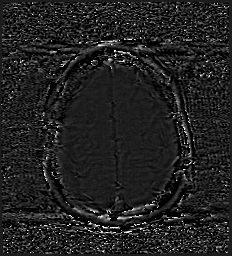
[im 60/60]
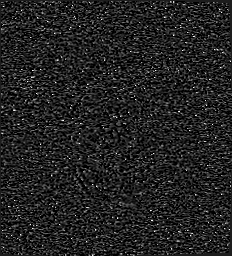

[Series 13: swi_images · axial · 3.0mm · 0.90mm/px · z∈[-97,+79]mm · 5 of 60 slices shown]
[im 1/60]
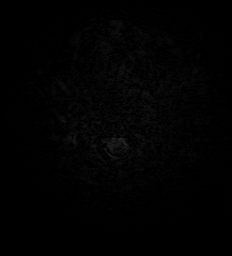
[im 15/60]
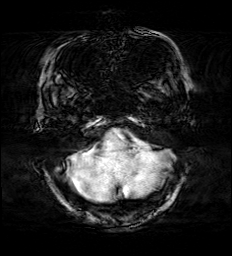
[im 30/60]
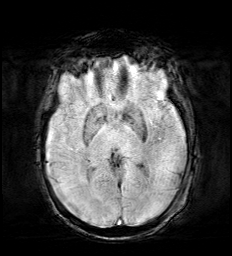
[im 45/60]
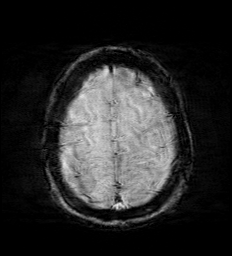
[im 60/60]
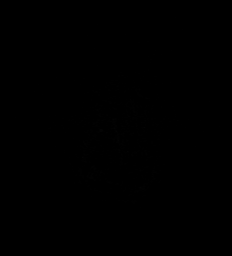

[Series 15: FLAIR · axial · 3.0mm · 0.53mm/px · z∈[-89,+72]mm · 4 of 55 slices shown]
[im 1/55]
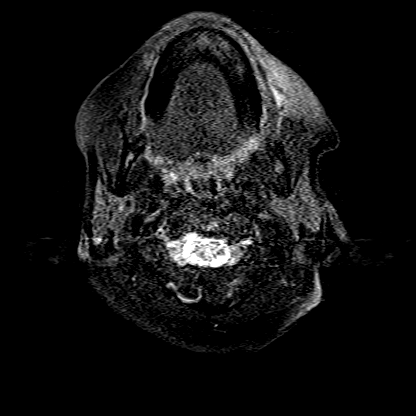
[im 19/55]
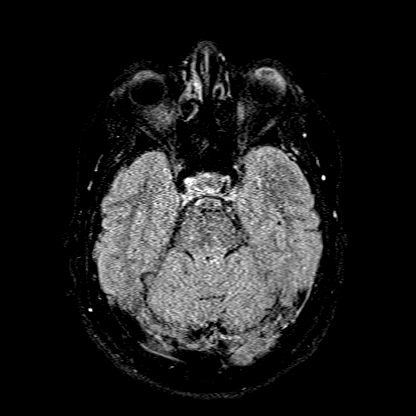
[im 37/55]
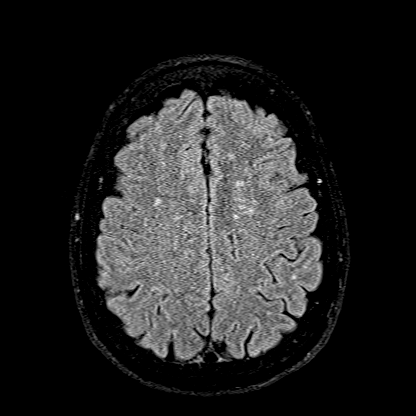
[im 55/55]
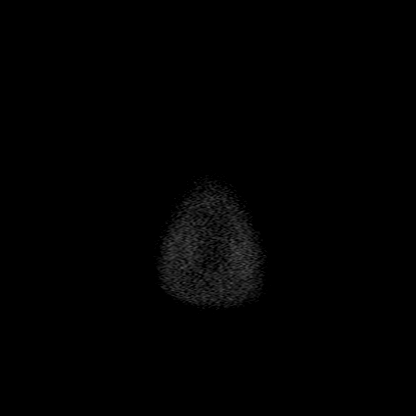

[Series 16: T1 · axial · 1.0mm · 0.98mm/px · z∈[-95,+79]mm · 11 of 176 slices shown (2 of 2)]
[im 1/176]
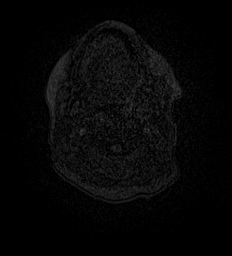
[im 14/176]
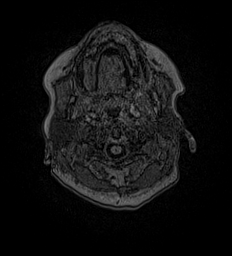
[im 27/176]
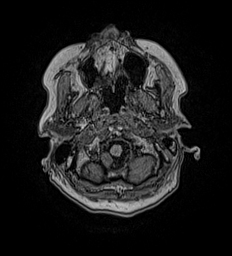
[im 41/176]
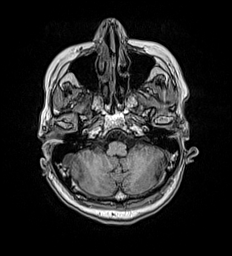
[im 54/176]
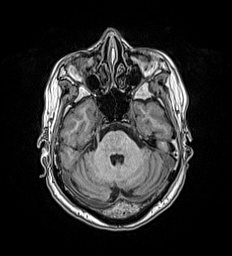
[im 68/176]
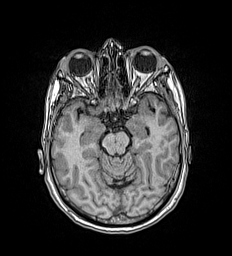
[im 81/176]
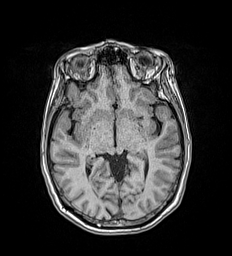
[im 95/176]
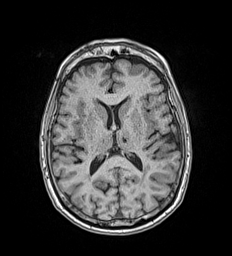
[im 122/176]
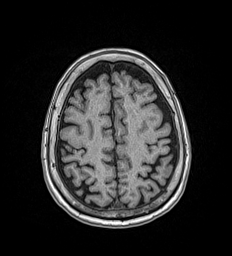
[im 149/176]
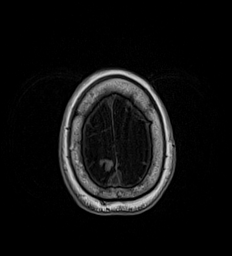
[im 176/176]
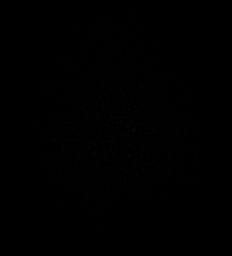

[Series 17: T2 · coronal · 5.0mm · 0.57mm/px · 2 of 29 slices shown (2 of 2)]
[im 1/29]
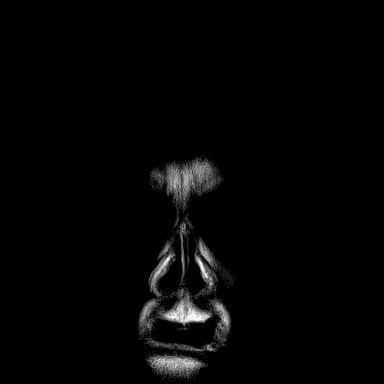
[im 29/29]
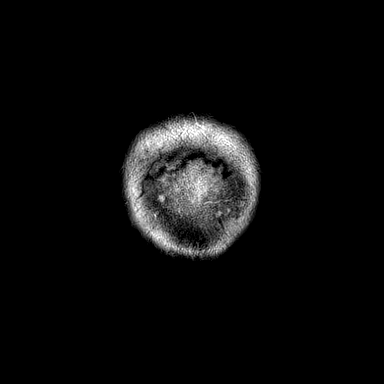

[45 of 48 positions shown; findings below may reference images not displayed]

FINDINGS: Brain: Ventricle size normal. Negative for acute infarct. Numerous
small periventricular and deep white matter hyperintensities
bilaterally. Chronic ischemia in the thalamus bilaterally. Brainstem
intact. Small chronic infarct right cerebellum. Negative for
hemorrhage or mass.

Vascular: Normal arterial flow voids

Skull and upper cervical spine: No focal skeletal lesion.

Sinuses/Orbits: Negative

Other: None
IMPRESSION: No acute abnormality. Moderate chronic microvascular ischemic
change.

## 2020-06-08 ENCOUNTER — Other Ambulatory Visit: Payer: Self-pay

## 2020-06-08 ENCOUNTER — Ambulatory Visit (INDEPENDENT_AMBULATORY_CARE_PROVIDER_SITE_OTHER): Payer: Medicare Other | Admitting: Licensed Clinical Social Worker

## 2020-06-08 ENCOUNTER — Encounter: Payer: Self-pay | Admitting: Licensed Clinical Social Worker

## 2020-06-08 DIAGNOSIS — F411 Generalized anxiety disorder: Secondary | ICD-10-CM

## 2020-06-08 DIAGNOSIS — F4310 Post-traumatic stress disorder, unspecified: Secondary | ICD-10-CM | POA: Diagnosis not present

## 2020-06-08 NOTE — Progress Notes (Signed)
Virtual Visit via Video Note  I connected with Vanessa Best on 06/08/20 at 11:00 AM EST by a video enabled telemedicine application and verified that I am speaking with the correct person using two identifiers.  Participating Parties Patient Provider  Location: Patient: Home Provider: Home Office   I discussed the limitations of evaluation and management by telemedicine and the availability of in person appointments. The patient expressed understanding and agreed to proceed.  THERAPY PROGRESS NOTE  Session Time: 68 Minutes  Participation Level: Active  Behavioral Response: Well GroomedAlertAnxious  Type of Therapy: Individual Therapy  Treatment Goals addressed: Anxiety and Coping  Interventions: CBT  Summary: Vanessa Best is a 65 y.o. female who presents with anxiety sxs. Pt reported she is doing well "mentally even though I am in pain physically". Pt reported that she has tried to quit smoking cigarettes, however due to the withdrawals she was unsuccessful at maintaining abstinence. Pt reported she plans on stopping for a least a few days up until her surgery scheduled in the middle of February. Pt reported she continues to rely on her faith and church for support and is setting boundaries for self-care as well as helping care for her aging uncle. Pt reported due to concerns of COVID19 she has decided not to engage in physical therapy via in-person appointments and is doing her best to stay at home and pace herself. Pt reported she did have an instance of panic since last session, however was able to manage her sxs by engaging in deep-breathing. Pt reported no other concerns at this time.  Suicidal/Homicidal: No  Therapist Response: Therapist met with patient for follow up. Therapist and patient processed thoughts, feelings and reactions to stressors. Therapist and patient reviewed coping skills utilized and ways patient is managing her anxiety.  Plan: Return again in 3  weeks.  Diagnosis: Axis I: Generalized Anxiety Disorder and Post Traumatic Stress Disorder    Axis II: N/A  Josephine Igo, LCSW, LCAS 06/08/2020

## 2020-06-17 DIAGNOSIS — I878 Other specified disorders of veins: Secondary | ICD-10-CM | POA: Insufficient documentation

## 2020-06-17 DIAGNOSIS — R208 Other disturbances of skin sensation: Secondary | ICD-10-CM | POA: Insufficient documentation

## 2020-06-17 DIAGNOSIS — M79672 Pain in left foot: Secondary | ICD-10-CM | POA: Insufficient documentation

## 2020-06-17 DIAGNOSIS — I872 Venous insufficiency (chronic) (peripheral): Secondary | ICD-10-CM | POA: Insufficient documentation

## 2020-06-17 DIAGNOSIS — M25475 Effusion, left foot: Secondary | ICD-10-CM | POA: Insufficient documentation

## 2020-06-25 DIAGNOSIS — I2510 Atherosclerotic heart disease of native coronary artery without angina pectoris: Secondary | ICD-10-CM | POA: Insufficient documentation

## 2020-06-28 ENCOUNTER — Ambulatory Visit: Payer: Medicare Other | Admitting: Licensed Clinical Social Worker

## 2020-06-28 ENCOUNTER — Other Ambulatory Visit: Payer: Self-pay

## 2020-08-04 ENCOUNTER — Ambulatory Visit (INDEPENDENT_AMBULATORY_CARE_PROVIDER_SITE_OTHER): Payer: Medicare Other | Admitting: Nurse Practitioner

## 2020-08-18 ENCOUNTER — Ambulatory Visit (INDEPENDENT_AMBULATORY_CARE_PROVIDER_SITE_OTHER): Payer: Medicare Other | Admitting: Nurse Practitioner

## 2020-09-14 ENCOUNTER — Encounter (INDEPENDENT_AMBULATORY_CARE_PROVIDER_SITE_OTHER): Payer: Self-pay

## 2020-09-14 ENCOUNTER — Ambulatory Visit (INDEPENDENT_AMBULATORY_CARE_PROVIDER_SITE_OTHER): Payer: Medicare Other | Admitting: Nurse Practitioner

## 2020-12-08 ENCOUNTER — Other Ambulatory Visit (HOSPITAL_COMMUNITY): Payer: Self-pay | Admitting: Internal Medicine

## 2020-12-08 ENCOUNTER — Other Ambulatory Visit: Payer: Self-pay | Admitting: Internal Medicine

## 2020-12-08 DIAGNOSIS — R519 Headache, unspecified: Secondary | ICD-10-CM

## 2020-12-08 DIAGNOSIS — Z9181 History of falling: Secondary | ICD-10-CM

## 2020-12-10 ENCOUNTER — Ambulatory Visit: Payer: Medicare Other

## 2020-12-28 ENCOUNTER — Ambulatory Visit: Payer: Medicare Other

## 2021-01-04 ENCOUNTER — Ambulatory Visit: Payer: Medicare Other

## 2021-04-18 ENCOUNTER — Other Ambulatory Visit: Payer: Self-pay | Admitting: Physician Assistant

## 2021-04-18 ENCOUNTER — Ambulatory Visit: Payer: Medicare Other

## 2021-04-18 ENCOUNTER — Other Ambulatory Visit: Payer: Self-pay | Admitting: Certified Registered Nurse Anesthetist

## 2021-04-18 DIAGNOSIS — M79604 Pain in right leg: Secondary | ICD-10-CM

## 2021-04-21 ENCOUNTER — Ambulatory Visit: Payer: Medicare Other

## 2021-04-28 ENCOUNTER — Ambulatory Visit: Payer: Medicare Other

## 2021-05-04 ENCOUNTER — Ambulatory Visit
Admission: RE | Admit: 2021-05-04 | Discharge: 2021-05-04 | Disposition: A | Discharge status: Home / Self Care | Payer: Medicare Other | Source: Ambulatory Visit | Attending: Physician Assistant | Admitting: Physician Assistant

## 2021-05-04 ENCOUNTER — Other Ambulatory Visit: Payer: Self-pay

## 2021-05-04 DIAGNOSIS — M79604 Pain in right leg: Secondary | ICD-10-CM | POA: Diagnosis present

## 2021-05-11 ENCOUNTER — Other Ambulatory Visit: Payer: Self-pay | Admitting: Internal Medicine

## 2021-05-11 DIAGNOSIS — Z1231 Encounter for screening mammogram for malignant neoplasm of breast: Secondary | ICD-10-CM

## 2021-05-17 ENCOUNTER — Ambulatory Visit (INDEPENDENT_AMBULATORY_CARE_PROVIDER_SITE_OTHER): Payer: Medicare Other | Admitting: Nurse Practitioner

## 2021-05-17 ENCOUNTER — Encounter (INDEPENDENT_AMBULATORY_CARE_PROVIDER_SITE_OTHER): Payer: Medicare Other

## 2021-05-27 ENCOUNTER — Encounter (INDEPENDENT_AMBULATORY_CARE_PROVIDER_SITE_OTHER): Payer: Medicare Other

## 2021-05-27 ENCOUNTER — Ambulatory Visit (INDEPENDENT_AMBULATORY_CARE_PROVIDER_SITE_OTHER): Payer: Commercial Managed Care - HMO | Admitting: Nurse Practitioner

## 2021-05-27 ENCOUNTER — Other Ambulatory Visit: Payer: Self-pay

## 2021-05-27 ENCOUNTER — Encounter (INDEPENDENT_AMBULATORY_CARE_PROVIDER_SITE_OTHER): Payer: Self-pay | Admitting: Nurse Practitioner

## 2021-05-27 VITALS — BP 160/92 | HR 64 | Resp 16 | Wt 155.0 lb

## 2021-05-27 DIAGNOSIS — I10 Essential (primary) hypertension: Secondary | ICD-10-CM

## 2021-05-27 DIAGNOSIS — I83899 Varicose veins of unspecified lower extremities with other complications: Secondary | ICD-10-CM

## 2021-06-05 ENCOUNTER — Encounter (INDEPENDENT_AMBULATORY_CARE_PROVIDER_SITE_OTHER): Payer: Self-pay | Admitting: Nurse Practitioner

## 2021-06-05 NOTE — Progress Notes (Signed)
Subjective:    Patient ID: Vanessa Best, female    DOB: 11/08/1955, 66 y.o.   MRN: [id number] Chief Complaint  Patient presents with   Follow-up    Ref Hande right leg pain dvt done 05/04/21    Vanessa Best is a 66 year old female that presents today for follow-up in regards to lower extremity edema.  The patient notes that she had swelling and pain in her lower extremity and was concerned for possible DVT.  She had a DVT study done and it was negative for DVT.  She notes that since that time the appointment was made the swelling has decreased and still has the pain.  Since the patient was last seen in our office she had been wearing compression stockings which were very helpful for controlling her swelling however she noted that because otherwise she stopped wearing them regularly.  She also remains very active and so she is not able to elevate her lower extremities much as possible.   Review of Systems  Cardiovascular:  Positive for leg swelling.  All other systems reviewed and are negative.     Objective:   Physical Exam Vitals reviewed.  HENT:     Head: Normocephalic.  Cardiovascular:     Rate and Rhythm: Normal rate.     Pulses: Normal pulses.  Pulmonary:     Effort: Pulmonary effort is normal.  Musculoskeletal:     Right lower leg: No edema.     Left lower leg: No edema.  Skin:    General: Skin is warm and dry.  Neurological:     Mental Status: She is alert and oriented to person, place, and time.  Psychiatric:        Mood and Affect: Mood normal.        Behavior: Behavior normal.        Thought Content: Thought content normal.        Judgment: Judgment normal.    BP (!) 160/92 (BP Location: Right Arm)    Pulse 64    Resp 16    Wt 155 lb (70.3 kg)    BMI 25.79 kg/m   Past Medical History:  Diagnosis Date   Acid reflux    Acne    Anxiety    Asthma    Chronic insomnia    Colon polyps    COPD (chronic obstructive pulmonary disease) (HCC)    Degenerative  disc disease, lumbar    Diverticulitis    Fibromyalgia    Gastritis    GERD (gastroesophageal reflux disease)    Hepatitis C 02/2020   OCD (obsessive compulsive disorder)    Osteoporosis    PTSD (post-traumatic stress disorder)     Social History   Socioeconomic History   Marital status: Married    Spouse name: Not on file   Number of children: 2   Years of education: colleg   Highest education level: Not on file  Occupational History   Occupation: disabled  Tobacco Use   Smoking status: Every Day    Packs/day: 1.00    Years: 49.00    Pack years: 49.00    Types: Cigarettes   Smokeless tobacco: Never  Substance and Sexual Activity   Alcohol use: Yes    Alcohol/week: 0.0 standard drinks    Comment: occ  wine   Drug use: No   Sexual activity: Not on file  Other Topics Concern   Not on file  Social History Narrative   Married, lives at home with  husband.  Has 2 children and 6 grandchildren.   Social Determinants of Health   Financial Resource Strain: Not on file  Food Insecurity: Not on file  Transportation Needs: Not on file  Physical Activity: Not on file  Stress: Not on file  Social Connections: Not on file  Intimate Partner Violence: Not on file    Past Surgical History:  Procedure Laterality Date   LEFT HEART CATH AND CORONARY ANGIOGRAPHY Left 04/07/2020   Procedure: LEFT HEART CATH AND CORONARY ANGIOGRAPHY;  Surgeon: Marcina Millard, MD;  Location: [facility];  Service: Cardiovascular;  Laterality: Left;   torn menicus     TOTAL HIP ARTHROPLASTY  09-04-14    Family History  Problem Relation Age of Onset   Prostate cancer Father    Bone cancer Father    Heart defect Father        valve   Heart failure Father    Osteoporosis Father    Asthma Mother    Eczema Sister    Breast cancer Maternal Aunt 30    Allergies  Allergen Reactions   Gabapentin Hives, Itching and Swelling   Hydrocodone-Acetaminophen Anaphylaxis    Pt states she  can take Hydrocodone, not sure how and why this was placed here   Pregabalin Nausea And Vomiting and Swelling   Sulfa Antibiotics Hives    No flowsheet data found.    CMP  No results found for: NA, K, CL, CO2, GLUCOSE, BUN, CREATININE, CALCIUM, PROT, ALBUMIN, AST, ALT, ALKPHOS, BILITOT, GFRNONAA, GFRAA   No results found.     Assessment & Plan:   1. Varicose veins of lower extremity with edema, unspecified laterality We reviewed the conservative therapy tactics including use of compression socks daily, elevation and activity.  The patient notes that the swelling became worse after stopping the use of her compression and conservative therapies.  We will also have patient return for reflux studies to determine if it is worsened or if it remains approximately the same.  We will have her return at her convenience.  2. Essential hypertension Continue antihypertensive medications as already ordered, these medications have been reviewed and there are no changes at this time.    Current Outpatient Medications on File Prior to Visit  Medication Sig Dispense Refill   albuterol (VENTOLIN HFA) 108 (90 Base) MCG/ACT inhaler Inhale 1-2 puffs into the lungs every 6 (six) hours as needed for wheezing or shortness of breath.      albuterol (VENTOLIN HFA) 108 (90 Base) MCG/ACT inhaler Inhale into the lungs.     ALPRAZolam (XANAX) 0.5 MG tablet Take 0.5-1 mg by mouth at bedtime as needed for sleep.      amLODipine (NORVASC) 5 MG tablet Take 5 mg by mouth daily.     aspirin EC 81 MG tablet Take 81 mg by mouth daily. Swallow whole.     cyclobenzaprine (FLEXERIL) 10 MG tablet Take 10 mg by mouth 2 (two) times daily as needed for muscle spasms.      diazepam (VALIUM) 2 MG tablet Take 2 mg by mouth daily as needed for anxiety.      Emollient (CETAPHIL) cream Apply 1 application topically as needed (skin irritation.).     furosemide (LASIX) 20 MG tablet Take 20 mg by mouth in the morning and at bedtime.      meloxicam (MOBIC) 15 MG tablet Take 15 mg by mouth daily.     ondansetron (ZOFRAN) 4 MG tablet Take 4 mg by mouth every  8 (eight) hours as needed for nausea or vomiting.     oxyCODONE (OXY IR/ROXICODONE) 5 MG immediate release tablet Take 5 mg by mouth 2 (two) times daily as needed (pain.).      pantoprazole (PROTONIX) 40 MG tablet Take 40 mg by mouth in the morning and at bedtime.      QUEtiapine (SEROQUEL) 100 MG tablet Take 100 mg by mouth as needed.     Vitamin D, Ergocalciferol, (DRISDOL) 1.25 MG (50000 UNIT) CAPS capsule Take 50,000 Units by mouth every Friday.      acetaminophen (TYLENOL) 500 MG tablet Take 1,000 mg by mouth every 8 (eight) hours as needed for moderate pain. (Patient not taking: Reported on 05/27/2021)     ascorbic acid (VITAMIN C) 500 MG tablet Take 500 mg by mouth daily.     diphenhydrAMINE (BENADRYL) 25 mg capsule Take 25 mg by mouth every 6 (six) hours as needed for allergies.     EPINEPHrine 0.3 mg/0.3 mL IJ SOAJ injection Inject 0.3 mg into the muscle as needed for anaphylaxis.      Garlic (GARLIQUE PO) Take 1 tablet by mouth daily.     ibuprofen (ADVIL) 800 MG tablet Take 800 mg by mouth every 8 (eight) hours as needed for moderate pain. (Patient not taking: Reported on 05/27/2021)     Ibuprofen-diphenhydrAMINE HCl (ADVIL PM) 200-25 MG CAPS Take 1-2 tablets by mouth at bedtime as needed (pain.).     ipratropium-albuterol (DUONEB) 0.5-2.5 (3) MG/3ML SOLN Take 3 mLs by nebulization every 6 (six) hours as needed (wheezing/shortness of breath.). (Patient not taking: Reported on 05/27/2021)     mometasone (ELOCON) 0.1 % ointment Apply topically daily. Apply to affected areas that are red and itchy at lower legs/feet. Avoid applying to face, groin, and axilla. Use as directed. Risk of skin atrophy with long-term use reviewed. (Patient not taking: Reported on 05/27/2021) 45 g 1   naproxen sodium (ALEVE) 220 MG tablet Take 440 mg by mouth 3 (three) times daily as needed (pain.).      sennosides-docusate sodium (SENOKOT-S) 8.6-50 MG tablet Take 2 tablets by mouth daily.     traMADol (ULTRAM) 50 MG tablet Take 50 mg by mouth 2 (two) times daily as needed (pain.).      No current facility-administered medications on file prior to visit.    There are no Patient Instructions on file for this visit. No follow-ups on file.   Georgiana Spinner, NP

## 2021-06-09 ENCOUNTER — Other Ambulatory Visit (INDEPENDENT_AMBULATORY_CARE_PROVIDER_SITE_OTHER): Payer: Self-pay | Admitting: Nurse Practitioner

## 2021-06-09 DIAGNOSIS — I83899 Varicose veins of unspecified lower extremities with other complications: Secondary | ICD-10-CM

## 2021-06-10 ENCOUNTER — Encounter (INDEPENDENT_AMBULATORY_CARE_PROVIDER_SITE_OTHER): Payer: Commercial Managed Care - HMO

## 2021-06-10 ENCOUNTER — Ambulatory Visit (INDEPENDENT_AMBULATORY_CARE_PROVIDER_SITE_OTHER): Payer: Commercial Managed Care - HMO | Admitting: Nurse Practitioner

## 2021-06-29 ENCOUNTER — Ambulatory Visit (INDEPENDENT_AMBULATORY_CARE_PROVIDER_SITE_OTHER): Payer: Medicare Other | Admitting: Nurse Practitioner

## 2021-06-29 ENCOUNTER — Ambulatory Visit (INDEPENDENT_AMBULATORY_CARE_PROVIDER_SITE_OTHER): Payer: Medicare Other

## 2021-06-29 ENCOUNTER — Other Ambulatory Visit: Payer: Self-pay

## 2021-06-29 VITALS — BP 155/81 | HR 57 | Ht 65.0 in | Wt 155.0 lb

## 2021-06-29 DIAGNOSIS — I10 Essential (primary) hypertension: Secondary | ICD-10-CM | POA: Diagnosis not present

## 2021-06-29 DIAGNOSIS — I83899 Varicose veins of unspecified lower extremities with other complications: Secondary | ICD-10-CM

## 2021-07-04 ENCOUNTER — Encounter (INDEPENDENT_AMBULATORY_CARE_PROVIDER_SITE_OTHER): Payer: Self-pay | Admitting: Nurse Practitioner

## 2021-07-04 NOTE — Progress Notes (Signed)
Subjective:    Patient ID: Vanessa Best, female    DOB: 06/01/1955, 66 y.o.   MRN: [id number] Chief Complaint  Patient presents with   Follow-up    Pt conv BIL ven  U/S    Vanessa Best is a 66 year old female that presents today for follow-up in regards to lower extremity edema.  The patient notes that she had swelling and pain in her lower extremity and was concerned for possible DVT.  She had a DVT study done and it was negative for DVT.  She notes that since that time the appointment was made the swelling has decreased and still has the pain.   She does note that since her last office visit the swelling has been much better control and she has been much more diligent with wearing her compression stockings.  Today noninvasive study showed no evidence of DVT or superficial thrombophlebitis bilaterally.  No evidence of deep venous insufficiency or superficial reflux noted bilaterally.   Review of Systems  Cardiovascular:  Negative for leg swelling.  All other systems reviewed and are negative.     Objective:   Physical Exam Vitals reviewed.  HENT:     Head: Normocephalic.  Cardiovascular:     Rate and Rhythm: Normal rate.     Pulses: Normal pulses.  Pulmonary:     Effort: Pulmonary effort is normal.  Skin:    General: Skin is warm and dry.  Neurological:     Mental Status: She is alert and oriented to person, place, and time.  Psychiatric:        Mood and Affect: Mood normal.        Behavior: Behavior normal.        Thought Content: Thought content normal.        Judgment: Judgment normal.    BP (!) 155/81    Pulse (!) 57    Ht 5\' 5"  (1.651 m)    Wt 155 lb (70.3 kg)    BMI 25.79 kg/m   Past Medical History:  Diagnosis Date   Acid reflux    Acne    Anxiety    Asthma    Chronic insomnia    Colon polyps    COPD (chronic obstructive pulmonary disease) (HCC)    Degenerative disc disease, lumbar    Diverticulitis    Fibromyalgia    Gastritis    GERD  (gastroesophageal reflux disease)    Hepatitis C 02/2020   OCD (obsessive compulsive disorder)    Osteoporosis    PTSD (post-traumatic stress disorder)     Social History   Socioeconomic History   Marital status: Married    Spouse name: Not on file   Number of children: 2   Years of education: colleg   Highest education level: Not on file  Occupational History   Occupation: disabled  Tobacco Use   Smoking status: Every Day    Packs/day: 1.00    Years: 49.00    Pack years: 49.00    Types: Cigarettes   Smokeless tobacco: Never  Substance and Sexual Activity   Alcohol use: Yes    Alcohol/week: 0.0 standard drinks    Comment: occ  wine   Drug use: No   Sexual activity: Not on file  Other Topics Concern   Not on file  Social History Narrative   Married, lives at home with husband.  Has 2 children and 6 grandchildren.   Social Determinants of Health   Financial Resource Strain: Not on file  Food Insecurity: Not on file  Transportation Needs: Not on file  Physical Activity: Not on file  Stress: Not on file  Social Connections: Not on file  Intimate Partner Violence: Not on file    Past Surgical History:  Procedure Laterality Date   LEFT HEART CATH AND CORONARY ANGIOGRAPHY Left 04/07/2020   Procedure: LEFT HEART CATH AND CORONARY ANGIOGRAPHY;  Surgeon: Marcina Millard, MD;  Location: [facility];  Service: Cardiovascular;  Laterality: Left;   torn menicus     TOTAL HIP ARTHROPLASTY  09-04-14    Family History  Problem Relation Age of Onset   Prostate cancer Father    Bone cancer Father    Heart defect Father        valve   Heart failure Father    Osteoporosis Father    Asthma Mother    Eczema Sister    Breast cancer Maternal Aunt 30    Allergies  Allergen Reactions   Gabapentin Hives, Itching and Swelling   Hydrocodone-Acetaminophen Anaphylaxis    Pt states she can take Hydrocodone, not sure how and why this was placed here   Pregabalin  Nausea And Vomiting and Swelling   Sulfa Antibiotics Hives    No flowsheet data found.    CMP  No results found for: NA, K, CL, CO2, GLUCOSE, BUN, CREATININE, CALCIUM, PROT, ALBUMIN, AST, ALT, ALKPHOS, BILITOT, GFRNONAA, GFRAA   No results found.     Assessment & Plan:   1. Varicose veins of lower extremity with edema, unspecified laterality No surgery or intervention at this point in time.  I have reviewed my discussion with the patient regarding venous insufficiency and why it causes symptoms. I have discussed with the patient the chronic skin changes that accompany venous insufficiency and the long term sequela such as ulceration. Patient will contnue wearing graduated compression stockings on a daily basis, as this has provided excellent control of his edema. The patient will put the stockings on first thing in the morning and removing them in the evening. The patient is reminded not to sleep in the stockings.  In addition, behavioral modification including elevation during the day will be initiated. Exercise is strongly encouraged.  Given the patient's good control and lack of any problems regarding the venous insufficiency and lymphedema a lymph pump in not need at this time.  The patient will follow up with me PRN should anything change.  The patient voices agreement with this plan.   2. Essential hypertension Continue antihypertensive medications as already ordered, these medications have been reviewed and there are no changes at this time.    Current Outpatient Medications on File Prior to Visit  Medication Sig Dispense Refill   acetaminophen (TYLENOL) 500 MG tablet Take 1,000 mg by mouth every 8 (eight) hours as needed for moderate pain.     albuterol (VENTOLIN HFA) 108 (90 Base) MCG/ACT inhaler Inhale 1-2 puffs into the lungs every 6 (six) hours as needed for wheezing or shortness of breath.      albuterol (VENTOLIN HFA) 108 (90 Base) MCG/ACT inhaler Inhale into the  lungs.     ALPRAZolam (XANAX) 0.5 MG tablet Take 0.5-1 mg by mouth at bedtime as needed for sleep.      amLODipine (NORVASC) 5 MG tablet Take 5 mg by mouth daily.     aspirin EC 81 MG tablet Take 81 mg by mouth daily. Swallow whole.     cyclobenzaprine (FLEXERIL) 10 MG tablet Take 10 mg by mouth 2 (  two) times daily as needed for muscle spasms.      diazepam (VALIUM) 2 MG tablet Take 2 mg by mouth daily as needed for anxiety.      Emollient (CETAPHIL) cream Apply 1 application topically as needed (skin irritation.).     EPINEPHrine 0.3 mg/0.3 mL IJ SOAJ injection Inject 0.3 mg into the muscle as needed for anaphylaxis.      furosemide (LASIX) 20 MG tablet Take 20 mg by mouth in the morning and at bedtime.     ibuprofen (ADVIL) 800 MG tablet Take 800 mg by mouth every 8 (eight) hours as needed for moderate pain.     ipratropium-albuterol (DUONEB) 0.5-2.5 (3) MG/3ML SOLN Take 3 mLs by nebulization every 6 (six) hours as needed (wheezing/shortness of breath.).     meloxicam (MOBIC) 15 MG tablet Take 15 mg by mouth daily.     mometasone (ELOCON) 0.1 % ointment Apply topically daily. Apply to affected areas that are red and itchy at lower legs/feet. Avoid applying to face, groin, and axilla. Use as directed. Risk of skin atrophy with long-term use reviewed. 45 g 1   ondansetron (ZOFRAN) 4 MG tablet Take 4 mg by mouth every 8 (eight) hours as needed for nausea or vomiting.     oxyCODONE (OXY IR/ROXICODONE) 5 MG immediate release tablet Take 5 mg by mouth 2 (two) times daily as needed (pain.).      pantoprazole (PROTONIX) 40 MG tablet Take 40 mg by mouth in the morning and at bedtime.      QUEtiapine (SEROQUEL) 100 MG tablet Take 100 mg by mouth as needed.     Vitamin D, Ergocalciferol, (DRISDOL) 1.25 MG (50000 UNIT) CAPS capsule Take 50,000 Units by mouth every Friday.      Garlic (GARLIQUE PO) Take 1 tablet by mouth daily.     Ibuprofen-diphenhydrAMINE HCl (ADVIL PM) 200-25 MG CAPS Take 1-2 tablets by  mouth at bedtime as needed (pain.).     naproxen sodium (ALEVE) 220 MG tablet Take 440 mg by mouth 3 (three) times daily as needed (pain.).     sennosides-docusate sodium (SENOKOT-S) 8.6-50 MG tablet Take 2 tablets by mouth daily.     traMADol (ULTRAM) 50 MG tablet Take 50 mg by mouth 2 (two) times daily as needed (pain.).      No current facility-administered medications on file prior to visit.    There are no Patient Instructions on file for this visit. No follow-ups on file.   Georgiana Spinner, NP

## 2021-07-06 ENCOUNTER — Other Ambulatory Visit: Payer: Self-pay

## 2021-07-08 ENCOUNTER — Other Ambulatory Visit: Payer: Self-pay | Admitting: Obstetrics and Gynecology

## 2021-07-08 DIAGNOSIS — Z1231 Encounter for screening mammogram for malignant neoplasm of breast: Secondary | ICD-10-CM

## 2021-07-26 ENCOUNTER — Other Ambulatory Visit: Payer: Self-pay | Admitting: Orthopedic Surgery

## 2021-07-26 DIAGNOSIS — M4316 Spondylolisthesis, lumbar region: Secondary | ICD-10-CM

## 2021-07-26 DIAGNOSIS — M4807 Spinal stenosis, lumbosacral region: Secondary | ICD-10-CM

## 2021-08-01 ENCOUNTER — Other Ambulatory Visit: Payer: Self-pay

## 2021-08-01 DIAGNOSIS — F17210 Nicotine dependence, cigarettes, uncomplicated: Secondary | ICD-10-CM

## 2021-08-01 DIAGNOSIS — Z87891 Personal history of nicotine dependence: Secondary | ICD-10-CM

## 2021-08-03 ENCOUNTER — Other Ambulatory Visit: Payer: Self-pay

## 2021-08-03 ENCOUNTER — Ambulatory Visit
Admission: RE | Admit: 2021-08-03 | Discharge: 2021-08-03 | Disposition: A | Discharge status: Home / Self Care | Payer: Medicare Other | Source: Ambulatory Visit | Attending: Orthopedic Surgery | Admitting: Orthopedic Surgery

## 2021-08-03 DIAGNOSIS — M4807 Spinal stenosis, lumbosacral region: Secondary | ICD-10-CM | POA: Diagnosis present

## 2021-08-03 DIAGNOSIS — M4316 Spondylolisthesis, lumbar region: Secondary | ICD-10-CM | POA: Diagnosis present

## 2021-08-16 ENCOUNTER — Ambulatory Visit: Payer: Medicare Other

## 2021-08-26 ENCOUNTER — Ambulatory Visit: Admission: RE | Admit: 2021-08-26 | Payer: Medicare Other | Source: Ambulatory Visit

## 2021-09-15 ENCOUNTER — Other Ambulatory Visit: Payer: Self-pay | Admitting: Internal Medicine

## 2021-09-15 DIAGNOSIS — Z9181 History of falling: Secondary | ICD-10-CM

## 2021-09-15 DIAGNOSIS — M6281 Muscle weakness (generalized): Secondary | ICD-10-CM

## 2021-09-15 DIAGNOSIS — R296 Repeated falls: Secondary | ICD-10-CM

## 2021-09-22 ENCOUNTER — Ambulatory Visit: Payer: Medicare Other

## 2021-09-25 ENCOUNTER — Ambulatory Visit
Admission: RE | Admit: 2021-09-25 | Discharge: 2021-09-25 | Disposition: A | Discharge status: Home / Self Care | Payer: Medicare Other | Source: Ambulatory Visit | Attending: Internal Medicine | Admitting: Internal Medicine

## 2021-09-25 DIAGNOSIS — Z9181 History of falling: Secondary | ICD-10-CM | POA: Insufficient documentation

## 2021-09-25 DIAGNOSIS — M6281 Muscle weakness (generalized): Secondary | ICD-10-CM | POA: Insufficient documentation

## 2021-09-25 DIAGNOSIS — R296 Repeated falls: Secondary | ICD-10-CM | POA: Diagnosis present

## 2022-03-20 ENCOUNTER — Encounter (INDEPENDENT_AMBULATORY_CARE_PROVIDER_SITE_OTHER): Payer: Self-pay

## 2022-07-04 ENCOUNTER — Ambulatory Visit: Payer: 59 | Admitting: Neurosurgery

## 2022-07-21 ENCOUNTER — Other Ambulatory Visit: Payer: Self-pay | Admitting: Family Medicine

## 2022-07-21 DIAGNOSIS — M5416 Radiculopathy, lumbar region: Secondary | ICD-10-CM

## 2022-07-25 ENCOUNTER — Ambulatory Visit: Payer: 59 | Admitting: Neurosurgery

## 2022-07-28 ENCOUNTER — Inpatient Hospital Stay: Admission: RE | Admit: 2022-07-28 | Payer: Medicare Other | Source: Ambulatory Visit

## 2022-10-27 ENCOUNTER — Other Ambulatory Visit: Payer: Self-pay | Admitting: *Deleted

## 2022-10-27 DIAGNOSIS — Z122 Encounter for screening for malignant neoplasm of respiratory organs: Secondary | ICD-10-CM

## 2022-10-27 DIAGNOSIS — Z87891 Personal history of nicotine dependence: Secondary | ICD-10-CM

## 2022-10-27 DIAGNOSIS — F17210 Nicotine dependence, cigarettes, uncomplicated: Secondary | ICD-10-CM

## 2022-11-06 ENCOUNTER — Ambulatory Visit: Payer: 59 | Attending: Acute Care

## 2023-02-15 ENCOUNTER — Other Ambulatory Visit: Payer: Self-pay | Admitting: Internal Medicine

## 2023-02-15 DIAGNOSIS — Z1231 Encounter for screening mammogram for malignant neoplasm of breast: Secondary | ICD-10-CM

## 2023-04-03 ENCOUNTER — Ambulatory Visit (INDEPENDENT_AMBULATORY_CARE_PROVIDER_SITE_OTHER): Payer: 59 | Admitting: Nurse Practitioner

## 2023-04-23 ENCOUNTER — Telehealth (INDEPENDENT_AMBULATORY_CARE_PROVIDER_SITE_OTHER): Payer: Self-pay

## 2023-04-23 NOTE — Telephone Encounter (Signed)
Patient called stating she needs an ultrasound for a DVT. She has discoloration on her leg with swelling. She stated she canceled her surgery because she it went away, but she feels its back.   Per Vivia Birmingham- patient she wear compressions and come in for a Venous Reflux ultrasound. If she feels she needs to, she may visit the ED.   Please schedule patient for a Venous Reflux study

## 2023-04-25 ENCOUNTER — Other Ambulatory Visit (INDEPENDENT_AMBULATORY_CARE_PROVIDER_SITE_OTHER): Payer: Self-pay | Admitting: Nurse Practitioner

## 2023-04-25 DIAGNOSIS — L819 Disorder of pigmentation, unspecified: Secondary | ICD-10-CM

## 2023-04-25 DIAGNOSIS — M7989 Other specified soft tissue disorders: Secondary | ICD-10-CM

## 2023-04-27 ENCOUNTER — Ambulatory Visit (INDEPENDENT_AMBULATORY_CARE_PROVIDER_SITE_OTHER): Payer: 59

## 2023-04-27 DIAGNOSIS — M7989 Other specified soft tissue disorders: Secondary | ICD-10-CM

## 2023-04-27 DIAGNOSIS — L819 Disorder of pigmentation, unspecified: Secondary | ICD-10-CM | POA: Diagnosis not present

## 2023-05-30 ENCOUNTER — Encounter: Payer: Self-pay | Admitting: Acute Care

## 2023-06-18 ENCOUNTER — Other Ambulatory Visit: Payer: Self-pay | Admitting: Internal Medicine

## 2023-06-18 DIAGNOSIS — R194 Change in bowel habit: Secondary | ICD-10-CM

## 2023-06-18 DIAGNOSIS — R5381 Other malaise: Secondary | ICD-10-CM

## 2023-06-18 DIAGNOSIS — G8929 Other chronic pain: Secondary | ICD-10-CM

## 2023-06-18 DIAGNOSIS — R634 Abnormal weight loss: Secondary | ICD-10-CM

## 2023-06-18 DIAGNOSIS — M5432 Sciatica, left side: Secondary | ICD-10-CM

## 2023-06-28 ENCOUNTER — Ambulatory Visit: Admission: RE | Admit: 2023-06-28 | Payer: 59 | Source: Ambulatory Visit

## 2023-07-03 ENCOUNTER — Other Ambulatory Visit (HOSPITAL_COMMUNITY): Payer: 59

## 2023-07-04 ENCOUNTER — Other Ambulatory Visit: Payer: Self-pay | Admitting: Family Medicine

## 2023-07-04 DIAGNOSIS — M5416 Radiculopathy, lumbar region: Secondary | ICD-10-CM

## 2023-07-12 ENCOUNTER — Ambulatory Visit: Admission: RE | Admit: 2023-07-12 | Payer: 59 | Source: Ambulatory Visit

## 2023-07-16 ENCOUNTER — Ambulatory Visit
Admission: RE | Admit: 2023-07-16 | Discharge: 2023-07-16 | Disposition: A | Discharge status: Home / Self Care | Payer: 59 | Source: Ambulatory Visit | Attending: Internal Medicine | Admitting: Internal Medicine

## 2023-07-16 ENCOUNTER — Ambulatory Visit
Admission: RE | Admit: 2023-07-16 | Discharge: 2023-07-16 | Disposition: A | Discharge status: Home / Self Care | Payer: 59 | Source: Ambulatory Visit | Attending: Family Medicine | Admitting: Family Medicine

## 2023-07-16 DIAGNOSIS — M5432 Sciatica, left side: Secondary | ICD-10-CM | POA: Diagnosis present

## 2023-07-16 DIAGNOSIS — R5381 Other malaise: Secondary | ICD-10-CM | POA: Insufficient documentation

## 2023-07-16 DIAGNOSIS — R5383 Other fatigue: Secondary | ICD-10-CM | POA: Insufficient documentation

## 2023-07-16 DIAGNOSIS — M5416 Radiculopathy, lumbar region: Secondary | ICD-10-CM

## 2023-07-16 DIAGNOSIS — R634 Abnormal weight loss: Secondary | ICD-10-CM | POA: Diagnosis present

## 2023-07-16 DIAGNOSIS — R194 Change in bowel habit: Secondary | ICD-10-CM | POA: Diagnosis present

## 2023-07-16 DIAGNOSIS — M5442 Lumbago with sciatica, left side: Secondary | ICD-10-CM | POA: Insufficient documentation

## 2023-07-16 DIAGNOSIS — G8929 Other chronic pain: Secondary | ICD-10-CM | POA: Diagnosis present

## 2023-07-16 MED ORDER — IOHEXOL 300 MG/ML  SOLN
100.0000 mL | Freq: Once | INTRAMUSCULAR | Status: AC | PRN
  Administered 2023-07-16: 100 mL via INTRAVENOUS

## 2023-09-20 ENCOUNTER — Encounter: Payer: Self-pay | Admitting: Acute Care

## 2023-10-09 ENCOUNTER — Encounter (INDEPENDENT_AMBULATORY_CARE_PROVIDER_SITE_OTHER): Payer: Self-pay

## 2023-12-04 DIAGNOSIS — F1411 Cocaine abuse, in remission: Secondary | ICD-10-CM | POA: Insufficient documentation

## 2023-12-04 DIAGNOSIS — Z79891 Long term (current) use of opiate analgesic: Secondary | ICD-10-CM | POA: Insufficient documentation

## 2023-12-04 DIAGNOSIS — Z789 Other specified health status: Secondary | ICD-10-CM | POA: Insufficient documentation

## 2023-12-04 DIAGNOSIS — Z79899 Other long term (current) drug therapy: Secondary | ICD-10-CM | POA: Insufficient documentation

## 2023-12-04 DIAGNOSIS — M899 Disorder of bone, unspecified: Secondary | ICD-10-CM | POA: Insufficient documentation

## 2023-12-04 DIAGNOSIS — G894 Chronic pain syndrome: Secondary | ICD-10-CM | POA: Insufficient documentation

## 2023-12-04 DIAGNOSIS — Z87898 Personal history of other specified conditions: Secondary | ICD-10-CM | POA: Insufficient documentation

## 2023-12-04 DIAGNOSIS — F1211 Cannabis abuse, in remission: Secondary | ICD-10-CM | POA: Insufficient documentation

## 2023-12-04 DIAGNOSIS — F1011 Alcohol abuse, in remission: Secondary | ICD-10-CM | POA: Insufficient documentation

## 2023-12-04 NOTE — Progress Notes (Unsigned)
 PROVIDER NOTE: Interpretation of information contained herein should be left to medically-trained personnel. Specific patient instructions are provided elsewhere under Patient Instructions section of medical record. This document was created in part using AI and STT-dictation technology, any transcriptional errors that may result from this process are unintentional.  Patient: Vanessa Best  Service: E/M Encounter  Provider: Eric DELENA Como, MD  DOB: [birth year]  Delivery: Face-to-face  Specialty: Interventional Pain Management  MRN: [id number]  Setting: Ambulatory outpatient facility  Specialty designation: 09  Type: New Patient  Location: Outpatient office facility  PCP: Sadie Manna, MD  DOS: 12/05/2023    Referring Prov.: Sadie Manna, MD   Primary Reason(s) for Visit: Encounter for initial evaluation of one or more chronic problems (new to examiner) potentially causing chronic pain, and posing a threat to normal musculoskeletal function. (Level of risk: High) CC: No chief complaint on file.  HPI  Vanessa Best is a 68 y.o. year old, female patient, who comes for the first time to our practice referred by Sadie Manna, MD for our initial evaluation of her chronic pain. She has Arthritis; Backache; Bilateral hip pain; Bipolar disorder (manic depression) (HCC); Chondromalacia patellae; Chronic anxiety; Diverticulosis of colon; Elevated rheumatoid factor; Generalized osteoarthrosis; Primary fibromyalgia syndrome; Hypertension; Injury of meniscus of knee, left, sequela; Internal hemorrhoids; Lumbar spondylosis; Lumbosacral spondylosis without myelopathy; Primary osteoarthritis of left hip; Right bundle branch block; Status post left hip replacement; Swelling; Swelling of joint; Tobacco abuse; Ureteric stone; Urolithiasis; Vitamin D deficiency; Atypical chest pain; Preoperative cardiovascular examination; Family history of heart disease; Emphysema lung (HCC); Coronary artery disease  involving native coronary artery of native heart without angina pectoris; Chronic hepatitis C without hepatic coma (HCC); History of TIA (transient ischemic attack); Mild intermittent asthma without complication; Prediabetes; S/P cardiac catheterization; Swelling of foot joint, left; Venous insufficiency; Venous stasis; Abnormal stress test; Burning sensation; Pain of left foot; Chronic pain syndrome; Tobacco use; Pharmacologic therapy; Disorder of skeletal system; Problems influencing health status; History of alcohol abuse; History of cannabis abuse; History of cocaine abuse (HCC); History of substance use disorder; Chronic prescription opiate use; and Long term prescription benzodiazepine use on their problem list. Today she comes in for evaluation of her No chief complaint on file.  Pain Assessment: Location:     Radiating:   Onset:   Duration:   Quality:   Severity:  /10 (subjective, self-reported pain score)  Effect on ADL:   Timing:   Modifying factors:   BP:    HR:    Onset and Duration: {Hx; Onset and Duration:210120511} Cause of pain: {Hx; Cause:210120521} Severity: {Pain Severity:210120502} Timing: {Symptoms; Timing:210120501} Aggravating Factors: {Causes; Aggravating pain factors:210120507} Alleviating Factors: {Causes; Alleviating Factors:210120500} Associated Problems: {Hx; Associated problems:210120515} Quality of Pain: {Hx; Symptom quality or Descriptor:210120531} Previous Examinations or Tests: {Hx; Previous examinations or test:210120529} Previous Treatments: {Hx; Previous Treatment:210120503}  Vanessa Best is being evaluated for possible interventional pain management therapies for the treatment of her chronic pain.  Discussed the use of AI scribe software for clinical note transcription with the patient, who gave verbal consent to proceed.  History of Present Illness            Vanessa Best has been informed that this initial visit was an evaluation only.  On  the follow up appointment I will go over the results, including ordered tests and available interventional therapies. At that time she will have the opportunity to decide whether to proceed with offered therapies or not. In the event that Vanessa Best  prefers avoiding interventional options, this will conclude our involvement in the case.  Medication management recommendations may be provided upon request.  Patient informed that diagnostic tests may be ordered to assist in identifying underlying causes, narrow the list of differential diagnoses and aid in determining candidacy for (or contraindications to) planned therapeutic interventions.  Historic Controlled Substance Pharmacotherapy Review PMP and historical list of controlled substances: Oxycodone IR 15 mg tablet, 1 tab p.o. twice daily (60/month) (last filled on 12/01/2023); alprazolam 0.5 mg tablet, 1 tab p.o. twice daily (# 20) (last filled on 11/01/2023) Most recently prescribed controlled substance(s): Opioid Analgesic: Oxycodone IR 15 mg tablet, 1 tab p.o. twice daily (60/month) (last filled on 12/01/2023) MME/day: 45 mg/day  Historical Monitoring: The patient  reports no history of drug use. List of prior UDS Testing: No results found for: MDMA, COCAINSCRNUR, PCPSCRNUR, PCPQUANT, CANNABQUANT, THCU, ETH, CBDTHCR, D8THCCBX, D9THCCBX Historical Background Evaluation: [place] PMP: PDMP reviewed during this encounter. Review of the past 53-months conducted.             PMP NARX Score Report:  Narcotic: 481 Sedative: 390 Stimulant: 000 [place] Department of public safety, offender search: Engineer, mining Information) Non-contributory Risk Assessment Profile: Aberrant behavior: None observed or detected today Risk factors for fatal opioid overdose: None identified today PMP NARX Overdose Risk Score: 130 Fatal overdose hazard ratio (HR): Calculation deferred Non-fatal overdose hazard ratio (HR): Calculation deferred Risk of opioid abuse  or dependence: 0.7-3.0% with doses <= 36 MME/day and 6.1-26% with doses >= 120 MME/day. Substance use disorder (SUD) risk level: See below Personal History of Substance Abuse (SUD-Substance use disorder):  Alcohol:    Illegal Drugs:    Rx Drugs:    ORT Risk Level calculation:    ORT Scoring interpretation table:  Score <3 = Low Risk for SUD  Score between 4-7 = Moderate Risk for SUD  Score >8 = High Risk for Opioid Abuse   PHQ-2 Depression Scale:  Total score:    PHQ-2 Scoring interpretation table: (Score and probability of major depressive disorder)  Score 0 = No depression  Score 1 = 15.4% Probability  Score 2 = 21.1% Probability  Score 3 = 38.4% Probability  Score 4 = 45.5% Probability  Score 5 = 56.4% Probability  Score 6 = 78.6% Probability   PHQ-9 Depression Scale:  Total score:    PHQ-9 Scoring interpretation table:  Score 0-4 = No depression  Score 5-9 = Mild depression  Score 10-14 = Moderate depression  Score 15-19 = Moderately severe depression  Score 20-27 = Severe depression (2.4 times higher risk of SUD and 2.89 times higher risk of overuse)   Pharmacologic Plan: As per protocol, I have not taken over any controlled substance management, pending the results of ordered tests and/or consults.            Initial impression: Pending review of available data and ordered tests.  Meds   Current Outpatient Medications:    acetaminophen (TYLENOL) 500 MG tablet, Take 1,000 mg by mouth every 8 (eight) hours as needed for moderate pain., Disp: , Rfl:    albuterol  (VENTOLIN  HFA) 108 (90 Base) MCG/ACT inhaler, Inhale 1-2 puffs into the lungs every 6 (six) hours as needed for wheezing or shortness of breath. , Disp: , Rfl:    albuterol  (VENTOLIN  HFA) 108 (90 Base) MCG/ACT inhaler, Inhale into the lungs., Disp: , Rfl:    ALPRAZolam (XANAX) 0.5 MG tablet, Take 0.5-1 mg by mouth at bedtime as needed for sleep. ,  Disp: , Rfl:    amLODipine (NORVASC) 5 MG tablet, Take 5 mg by  mouth daily., Disp: , Rfl:    aspirin  EC 81 MG tablet, Take 81 mg by mouth daily. Swallow whole., Disp: , Rfl:    cyclobenzaprine (FLEXERIL) 10 MG tablet, Take 10 mg by mouth 2 (two) times daily as needed for muscle spasms. , Disp: , Rfl:    diazepam (VALIUM) 2 MG tablet, Take 2 mg by mouth daily as needed for anxiety. , Disp: , Rfl:    Emollient (CETAPHIL) cream, Apply 1 application topically as needed (skin irritation.)., Disp: , Rfl:    EPINEPHrine 0.3 mg/0.3 mL IJ SOAJ injection, Inject 0.3 mg into the muscle as needed for anaphylaxis. , Disp: , Rfl:    furosemide (LASIX) 20 MG tablet, Take 20 mg by mouth in the morning and at bedtime., Disp: , Rfl:    Garlic (GARLIQUE PO), Take 1 tablet by mouth daily., Disp: , Rfl:    ibuprofen (ADVIL) 800 MG tablet, Take 800 mg by mouth every 8 (eight) hours as needed for moderate pain., Disp: , Rfl:    Ibuprofen-diphenhydrAMINE HCl (ADVIL PM) 200-25 MG CAPS, Take 1-2 tablets by mouth at bedtime as needed (pain.)., Disp: , Rfl:    ipratropium-albuterol  (DUONEB) 0.5-2.5 (3) MG/3ML SOLN, Take 3 mLs by nebulization every 6 (six) hours as needed (wheezing/shortness of breath.)., Disp: , Rfl:    meloxicam (MOBIC) 15 MG tablet, Take 15 mg by mouth daily., Disp: , Rfl:    mometasone  (ELOCON ) 0.1 % ointment, Apply topically daily. Apply to affected areas that are red and itchy at lower legs/feet. Avoid applying to face, groin, and axilla. Use as directed. Risk of skin atrophy with long-term use reviewed., Disp: 45 g, Rfl: 1   naproxen sodium (ALEVE) 220 MG tablet, Take 440 mg by mouth 3 (three) times daily as needed (pain.)., Disp: , Rfl:    ondansetron  (ZOFRAN ) 4 MG tablet, Take 4 mg by mouth every 8 (eight) hours as needed for nausea or vomiting., Disp: , Rfl:    oxyCODONE (OXY IR/ROXICODONE) 5 MG immediate release tablet, Take 5 mg by mouth 2 (two) times daily as needed (pain.). , Disp: , Rfl:    pantoprazole (PROTONIX) 40 MG tablet, Take 40 mg by mouth in the  morning and at bedtime. , Disp: , Rfl:    QUEtiapine (SEROQUEL) 100 MG tablet, Take 100 mg by mouth as needed., Disp: , Rfl:    sennosides-docusate sodium (SENOKOT-S) 8.6-50 MG tablet, Take 2 tablets by mouth daily., Disp: , Rfl:    traMADol (ULTRAM) 50 MG tablet, Take 50 mg by mouth 2 (two) times daily as needed (pain.). , Disp: , Rfl:    Vitamin D, Ergocalciferol, (DRISDOL) 1.25 MG (50000 UNIT) CAPS capsule, Take 50,000 Units by mouth every Friday. , Disp: , Rfl:   Imaging Review  Lumbosacral Imaging: Lumbar MR wo contrast: Results for orders placed during the hospital encounter of 07/16/23 MR LUMBAR SPINE WO CONTRAST  Narrative CLINICAL DATA:  67 year old female with increasing chronic low back pain.  EXAM: MRI LUMBAR SPINE WITHOUT CONTRAST  TECHNIQUE: Multiplanar, multisequence MR imaging of the lumbar spine was performed. No intravenous contrast was administered.  COMPARISON:  Lumbar MRI 08/03/2021.  CT chest 05/19/2020. [facility] CT Abdomen and Pelvis 03/16/2010.  FINDINGS: Segmentation: Probable unilateral C7 cervical ribs on the 2021 comparison, with hypoplastic or absent ribs at T12 of that exam. Lowest full size ribs therefore T11 and this results in a  mostly sacralized L5 level with vestigial S1-S2 disc space. This numbering system differs from the 2023 MRI. Correlation with radiographs is recommended prior to any operative intervention.  Alignment: Stable since 2023. Chronic grade 1 anterolisthesis of L4 on L5, 5 mm. Mildly exaggerated lumbar lordosis otherwise.  Vertebrae: Visualized bone marrow signal is within normal limits. No marrow edema or evidence of acute osseous abnormality. Maintained vertebral body height. Intact visible sacrum.  Conus medullaris and cauda equina: Conus extends to the T12-L1 level. No lower spinal cord or conus signal abnormality. Generally normal cauda equina nerve roots.  Paraspinal and other soft tissues: Severe  diverticulosis of the large bowel visible anterior to the lower lumbar spine and sacrum. Other Visualized abdominal viscera and paraspinal soft tissues are within normal limits.  Disc levels:  T11-T12: Negative.  T12-L1:  Mild posterior disc bulge or protrusion.  No stenosis.  L1-L2:  Negative.  L2-L3:  Negative.  L3-L4:  Negative disc.  Mild facet hypertrophy.  No stenosis.  L4-L5: Chronic grade 1 spondylolisthesis, disc desiccation, circumferential pseudo disc. Moderate facet and ligament flavum hypertrophy. No significant spinal stenosis. No lateral recess stenosis. Mild to moderate bilateral L4 foraminal stenosis appears stable.  L5-S1:  Mostly sacralized.  Otherwise negative.  IMPRESSION: 1. 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 This numbering system differs from a 2023 MRI. Correlation with radiographs is recommended prior to any operative intervention. 2. Stable MRI appearance of the lumbar spine. Chronic L4-L5 grade 1 spondylolisthesis, with disc and moderate posterior element degeneration. No significant spinal or lateral recess stenosis. Mild to moderate bilateral L4 foraminal stenosis. 3. Extensive diverticulosis of the colon.   Electronically Signed By: VEAR Hurst M.D. On: 07/16/2023 10:45  Complexity Note: Imaging results reviewed.                         ROS  Cardiovascular: {Hx; Cardiovascular History:210120525} Pulmonary or Respiratory: {Hx; Pumonary and/or Respiratory History:210120523} Neurological: {Hx; Neurological:210120504} Psychological-Psychiatric: {Hx; Psychological-Psychiatric History:210120512} Gastrointestinal: {Hx; Gastrointestinal:210120527} Genitourinary: {Hx; Genitourinary:210120506} Hematological: {Hx; Hematological:210120510} Endocrine: {Hx; Endocrine history:210120509} Rheumatologic: {Hx;  Rheumatological:210120530} Musculoskeletal: {Hx; Musculoskeletal:210120528} Work History: {Hx; Work history:210120514}  Allergies  Ms. Steagall is allergic to gabapentin, hydrocodone-acetaminophen, pregabalin, and sulfa antibiotics.  Laboratory Chemistry Profile   Renal No results found for: BUN, CREATININE, LABCREA, BCR, GFR, GFRAA, GFRNONAA, SPECGRAV, PHUR, PROTEINUR   Electrolytes No results found for: NA, K, CL, CALCIUM, MG, PHOS   Hepatic No results found for: AST, ALT, ALBUMIN, ALKPHOS, AMYLASE, LIPASE, AMMONIA   ID Lab Results  Component Value Date   SARSCOV2NAA NEGATIVE 04/02/2020     Bone No results found for: VD25OH, [id number], CI6874NY7, CI7874NY7, 25OHVITD1, 25OHVITD2, 25OHVITD3, TESTOFREE, TESTOSTERONE   Endocrine No results found for: GLUCOSE, GLUCOSEU, HGBA1C, TSH, FREET4, TESTOFREE, TESTOSTERONE, SHBG, ESTRADIOL, ESTRADIOLPCT, ESTRADIOLFRE, LABPREG, ACTH, CRTSLPL, UCORFRPERLTR, UCORFRPERDAY, CORTISOLBASE   Neuropathy No results found for: VITAMINB12, FOLATE, HGBA1C, HIV   CNS No results found for: COLORCSF, APPEARCSF, RBCCOUNTCSF, WBCCSF, POLYSCSF, LYMPHSCSF, EOSCSF, PROTEINCSF, GLUCCSF, JCVIRUS, CSFOLI, IGGCSF, LABACHR, ACETBL   Inflammation (CRP: Acute  ESR: Chronic) No results found for: CRP, ESRSEDRATE, LATICACIDVEN   Rheumatology No results found for: RF, ANA, LABURIC, URICUR, LYMEIGGIGMAB, LYMEABIGMQN, HLAB27   Coagulation No results found for: INR, LABPROT, APTT, PLT, DDIMER, LABHEMA, VITAMINK1, AT3   Cardiovascular No results found for: BNP, CKTOTAL, CKMB, TROPONINI, HGB, HCT, LABVMA, EPIRU, EPINEPH24HUR, NOREPRU, NOREPI24HUR, DOPARU, DOPAM24HRUR  Screening Lab Results  Component Value Date   SARSCOV2NAA NEGATIVE 04/02/2020     Cancer No results  found for: CEA, CA125, LABCA2   Allergens No results found for: ALMOND, APPLE, ASPARAGUS, AVOCADO, BANANA, BARLEY, BASIL, BAYLEAF, GREENBEAN, LIMABEAN, WHITEBEAN, BEEFIGE, REDBEET, BLUEBERRY, BROCCOLI, CABBAGE, MELON, CARROT, CASEIN, CASHEWNUT, CAULIFLOWER, CELERY     Note: No results found under the [employer] electronic medical record  [facility]  Drug: Ms. Cordone  reports no history of drug use. Alcohol:  reports current alcohol use. Tobacco:  reports that she has been smoking cigarettes. She has a 49 pack-year smoking history. She has never used smokeless tobacco. Medical:  has a past medical history of Acid reflux, Acne, Anxiety, Asthma, Chronic insomnia, Colon polyps, COPD (chronic obstructive pulmonary disease) (HCC), Degenerative disc disease, lumbar, Diverticulitis, Fibromyalgia, Gastritis, GERD (gastroesophageal reflux disease), Hepatitis C (02/2020), OCD (obsessive compulsive disorder), Osteoporosis, and PTSD (post-traumatic stress disorder). Family: family history includes Asthma in her mother; Bone cancer in her father; Breast cancer (age of onset: 84) in her maternal aunt; Eczema in her sister; Heart defect in her father; Heart failure in her father; Osteoporosis in her father; Prostate cancer in her father.  Past Surgical History:  Procedure Laterality Date   LEFT HEART CATH AND CORONARY ANGIOGRAPHY Left 04/07/2020   Procedure: LEFT HEART CATH AND CORONARY ANGIOGRAPHY;  Surgeon: Ammon Blunt, MD;  Location: [facility] INVASIVE CV LAB;  Service: Cardiovascular;  Laterality: Left;   torn menicus     TOTAL HIP ARTHROPLASTY  09-04-14   Active Ambulatory Problems    Diagnosis Date Noted   Arthritis 06/06/2016   Backache 11/11/2012   Bilateral hip pain 03/07/2019   Bipolar disorder (manic depression) (HCC) 09/04/2014   Chondromalacia patellae 09/13/2018   Chronic anxiety 11/11/2012   Diverticulosis of colon 11/11/2012    Elevated rheumatoid factor 06/06/2016   Generalized osteoarthrosis 11/11/2012   Primary fibromyalgia syndrome 09/04/2014   Hypertension 05/30/2012   Injury of meniscus of knee, left, sequela 06/06/2016   Internal hemorrhoids 11/11/2012   Lumbar spondylosis 09/13/2018   Lumbosacral spondylosis without myelopathy 09/04/2012   Primary osteoarthritis of left hip 06/06/2016   Right bundle branch block 04/10/2011   Status post left hip replacement 09/04/2014   Swelling 02/26/2018   Swelling of joint 02/26/2018   Tobacco abuse 06/06/2016   Ureteric stone 11/11/2012   Urolithiasis 12/24/2012   Vitamin D deficiency 06/06/2016   Atypical chest pain 01/01/2020   Preoperative cardiovascular examination 01/02/2020   Family history of heart disease 03/25/2020   Emphysema lung (HCC) 03/22/2020   Coronary artery disease involving native coronary artery of native heart without angina pectoris 06/25/2020   Chronic hepatitis C without hepatic coma (HCC) 03/22/2020   History of TIA (transient ischemic attack) 03/22/2020   Mild intermittent asthma without complication 03/22/2020   Prediabetes 03/22/2020   S/P cardiac catheterization 04/29/2020   Swelling of foot joint, left 06/17/2020   Venous insufficiency 06/17/2020   Venous stasis 06/17/2020   Abnormal stress test 03/22/2020   Burning sensation 06/17/2020   Pain of left foot 06/17/2020   Chronic pain syndrome 12/04/2023   Tobacco use 06/06/2016   Pharmacologic therapy 12/04/2023   Disorder of skeletal system 12/04/2023   Problems influencing health status 12/04/2023   History of alcohol abuse 12/04/2023   History of cannabis abuse 12/04/2023   History of cocaine abuse (HCC) 12/04/2023   History of substance use disorder 12/04/2023   Chronic prescription opiate use 12/04/2023   Long term prescription benzodiazepine use  12/04/2023   Resolved Ambulatory Problems    Diagnosis Date Noted   Nondependent alcohol abuse 11/11/2012   Nondependent  cannabis abuse 11/11/2012   Nondependent cocaine abuse (HCC) 11/11/2012   OSA (obstructive sleep apnea) 03/07/2019   Unintended weight gain 03/07/2019   Past Medical History:  Diagnosis Date   Acid reflux    Acne    Anxiety    Asthma    Chronic insomnia    Colon polyps    COPD (chronic obstructive pulmonary disease) (HCC)    Degenerative disc disease, lumbar    Diverticulitis    Fibromyalgia    Gastritis    GERD (gastroesophageal reflux disease)    Hepatitis C 02/2020   OCD (obsessive compulsive disorder)    Osteoporosis    PTSD (post-traumatic stress disorder)    Constitutional Exam  General appearance: Well nourished, well developed, and well hydrated. In no apparent acute distress There were no vitals filed for this visit. BMI Assessment: Estimated body mass index is 25.79 kg/m as calculated from the following:   Height as of 06/29/21: 5' 5 (1.651 m).   Weight as of 06/29/21: 155 lb (70.3 kg).  BMI interpretation table: BMI level Category Range association with higher incidence of chronic pain  <18 kg/m2 Underweight   18.5-24.9 kg/m2 Ideal body weight   25-29.9 kg/m2 Overweight Increased incidence by 20%  30-34.9 kg/m2 Obese (Class I) Increased incidence by 68%  35-39.9 kg/m2 Severe obesity (Class II) Increased incidence by 136%  >40 kg/m2 Extreme obesity (Class III) Increased incidence by 254%   Patient's current BMI Ideal Body weight  There is no height or weight on file to calculate BMI. Patient weight not recorded   BMI Readings from Last 4 Encounters:  06/29/21 25.79 kg/m  05/27/21 25.79 kg/m  05/19/20 25.79 kg/m  04/07/20 26.79 kg/m   Wt Readings from Last 4 Encounters:  06/29/21 155 lb (70.3 kg)  05/27/21 155 lb (70.3 kg)  05/19/20 155 lb (70.3 kg)  04/07/20 161 lb (73 kg)    Psych/Mental status: Alert, oriented x 3 (person, place, & time)       Eyes: PERLA Respiratory: No evidence of acute respiratory distress  Assessment  Primary Diagnosis &  Pertinent Problem List: The primary encounter diagnosis was Chronic pain syndrome. Diagnoses of Pharmacologic therapy, Disorder of skeletal system, Problems influencing health status, History of alcohol abuse, History of cannabis abuse, History of cocaine abuse (HCC), History of substance use disorder, Chronic prescription opiate use, and Long term prescription benzodiazepine use were also pertinent to this visit.  Visit Diagnosis (New problems to examiner): 1. Chronic pain syndrome   2. Pharmacologic therapy   3. Disorder of skeletal system   4. Problems influencing health status   5. History of alcohol abuse   6. History of cannabis abuse   7. History of cocaine abuse (HCC)   8. History of substance use disorder   9. Chronic prescription opiate use   10. Long term prescription benzodiazepine use    Plan of Care (Initial workup plan)  Note: Ms. Mcauliff was reminded that as per protocol, today's visit has been an evaluation only. We have not taken over the patient's controlled substance management.  Problem-specific plan: Assessment and Plan            Lab Orders  No laboratory test(s) ordered today   Imaging Orders  No imaging studies ordered today   Referral Orders  No referral(s) requested today   Procedure Orders  No procedure(s) ordered today   Pharmacotherapy (current): Medications ordered:  No orders of the defined types were placed in this encounter.  Medications administered during this visit: Rayah Marcott had no medications administered during this visit.   Analgesic Pharmacotherapy:  Opioid Analgesics: For patients currently taking or requesting to take opioid analgesics, in accordance with [place]  Medical Board Guidelines, we will assess their risks and indications for the use of these substances. After completing our evaluation, we may offer recommendations, but we no longer take patients for medication management. The prescribing physician will  ultimately decide, based on his/her training and level of comfort whether to adopt any of the recommendations, including whether or not to prescribe such medicines.  Membrane stabilizer: To be determined at a later time  Muscle relaxant: To be determined at a later time  NSAID: To be determined at a later time  Other analgesic(s): To be determined at a later time   Interventional management options: Ms. Parfitt was informed that there is no guarantee that she would be a candidate for interventional therapies. The decision will be based on the results of diagnostic studies, as well as Ms. Maher's risk profile.  Procedure(s) under consideration:  Pending results of ordered studies     Interventional Therapies  Risk Factors  Considerations  Medical Comorbidities:     Planned  Pending:      Under consideration:   Pending   Completed: (Analgesic benefit)1  None at this time   Therapeutic  Palliative (PRN) options:   None established   Completed by other providers:   None reported  1(Analgesic benefit): Expressed in percentage (%). (Local anesthetic[LA] +/- sedation  L.A.Local Anesthetic  Steroid benefit  Ongoing benefit)   Provider-requested follow-up: No follow-ups on file.  Future Appointments  Date Time Provider Department Center  12/05/2023  2:00 PM Tanya Glisson, MD [facility] None   I discussed the assessment and treatment plan with the patient. The patient was provided an opportunity to ask questions and all were answered. The patient agreed with the plan and demonstrated an understanding of the instructions.  Patient advised to call back or seek an in-person evaluation if the symptoms or condition worsens.  Duration of encounter: *** minutes.  Total time on encounter, as per AMA guidelines included both the face-to-face and non-face-to-face time personally spent by the physician and/or other qualified health care professional(s) on the day of the encounter  (includes time in activities that require the physician or other qualified health care professional and does not include time in activities normally performed by clinical staff). Physician's time may include the following activities when performed: Preparing to see the patient (e.g., pre-charting review of records, searching for previously ordered imaging, lab work, and nerve conduction tests) Review of prior analgesic pharmacotherapies. Reviewing PMP Interpreting ordered tests (e.g., lab work, imaging, nerve conduction tests) Performing post-procedure evaluations, including interpretation of diagnostic procedures Obtaining and/or reviewing separately obtained history Performing a medically appropriate examination and/or evaluation Counseling and educating the patient/family/caregiver Ordering medications, tests, or procedures Referring and communicating with other health care professionals (when not separately reported) Documenting clinical information in the electronic or other health record Independently interpreting results (not separately reported) and communicating results to the patient/ family/caregiver Care coordination (not separately reported)  Note by: Glisson DELENA Tanya, MD (TTS and AI technology used. I apologize for any typographical errors that were not detected and corrected.) Date: 12/05/2023; Time: 5:39 PM

## 2023-12-04 NOTE — Patient Instructions (Signed)

## 2023-12-05 ENCOUNTER — Ambulatory Visit (HOSPITAL_BASED_OUTPATIENT_CLINIC_OR_DEPARTMENT_OTHER): Admitting: Pain Medicine

## 2023-12-05 DIAGNOSIS — F1011 Alcohol abuse, in remission: Secondary | ICD-10-CM

## 2023-12-05 DIAGNOSIS — Z87898 Personal history of other specified conditions: Secondary | ICD-10-CM

## 2023-12-05 DIAGNOSIS — Z91199 Patient's noncompliance with other medical treatment and regimen due to unspecified reason: Secondary | ICD-10-CM

## 2023-12-05 DIAGNOSIS — M899 Disorder of bone, unspecified: Secondary | ICD-10-CM

## 2023-12-05 DIAGNOSIS — G8929 Other chronic pain: Secondary | ICD-10-CM

## 2023-12-05 DIAGNOSIS — F1211 Cannabis abuse, in remission: Secondary | ICD-10-CM

## 2023-12-05 DIAGNOSIS — Z79891 Long term (current) use of opiate analgesic: Secondary | ICD-10-CM

## 2023-12-05 DIAGNOSIS — Z79899 Other long term (current) drug therapy: Secondary | ICD-10-CM

## 2023-12-05 DIAGNOSIS — Z789 Other specified health status: Secondary | ICD-10-CM

## 2023-12-05 DIAGNOSIS — G894 Chronic pain syndrome: Secondary | ICD-10-CM

## 2023-12-05 DIAGNOSIS — F1411 Cocaine abuse, in remission: Secondary | ICD-10-CM
